# Patient Record
Sex: Female | Born: 1985
Health system: Southern US, Community
[De-identification: ages and names within clinical notes are randomized; demographics above are authoritative.]

## PROBLEM LIST (undated history)

## (undated) DIAGNOSIS — K219 Gastro-esophageal reflux disease without esophagitis: Secondary | ICD-10-CM

## (undated) DIAGNOSIS — O24419 Gestational diabetes mellitus in pregnancy, unspecified control: Secondary | ICD-10-CM

## (undated) HISTORY — DX: Gestational diabetes mellitus in pregnancy, unspecified control: O24.419

---
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
## 2007-04-12 IMAGING — CT CT HEAD WITHOUT CONTRAST
2 series · 16 of 30 positions shown, 20 images · non-contrast
Comparison: none

REASON FOR EXAM: Headaches
COMMENTS:

[Series 2: without · axial · non-contrast · 0.39mm/px · z∈[+454,+574]mm · 13 of 29 slices shown, 17 images]
[im 3/29  brain]
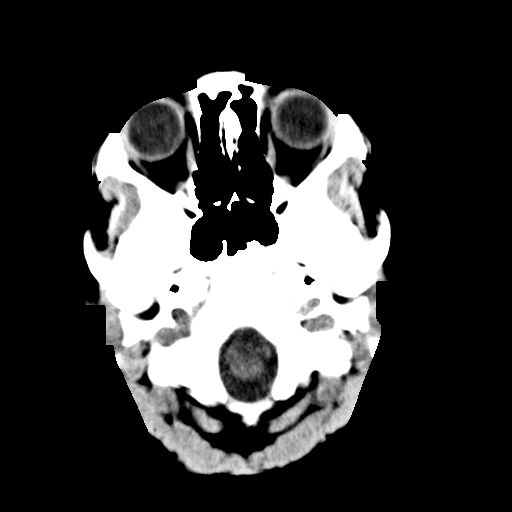
[im 3/29  bone]
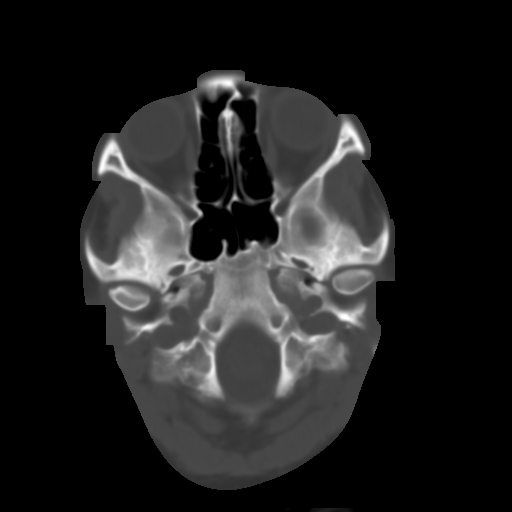
[im 5/29  brain]
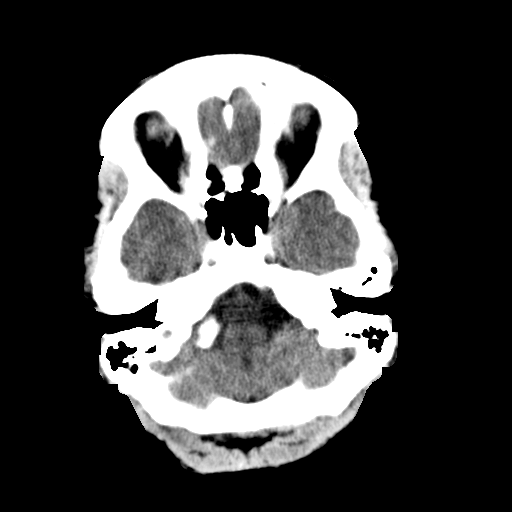
[im 7/29  brain]
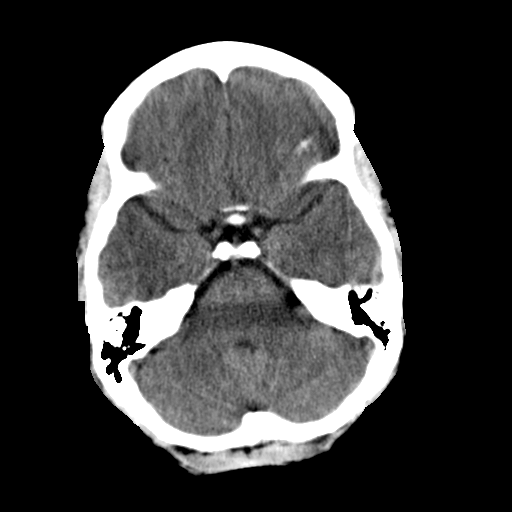
[im 9/29  brain]
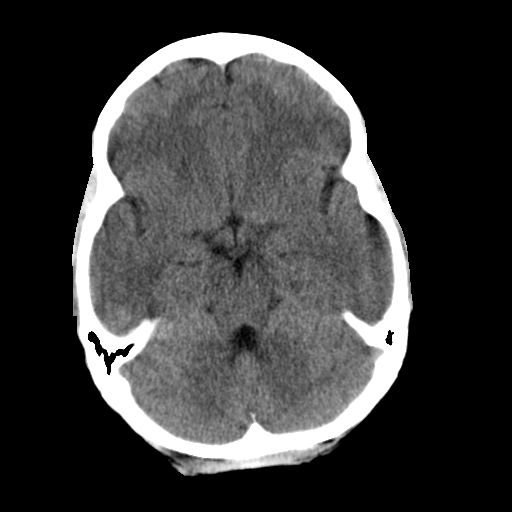
[im 11/29  brain]
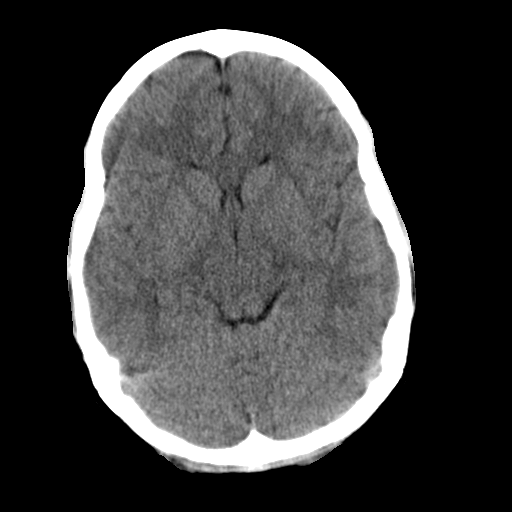
[im 11/29  bone]
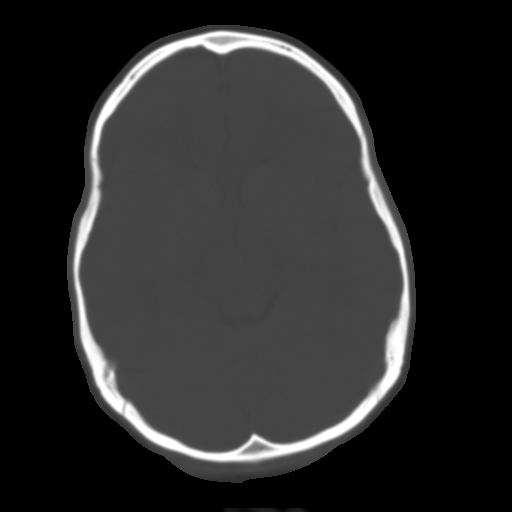
[im 13/29  brain]
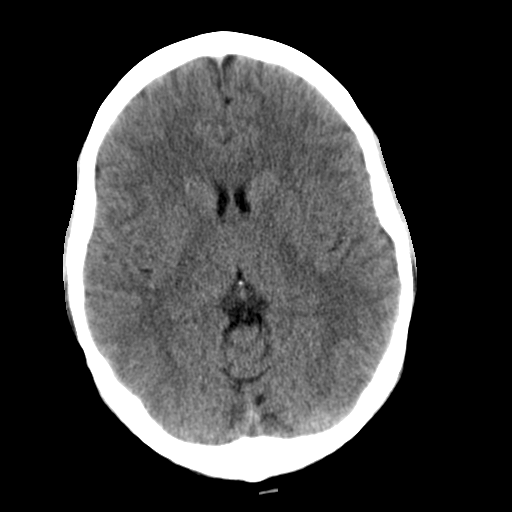
[im 15/29  brain]
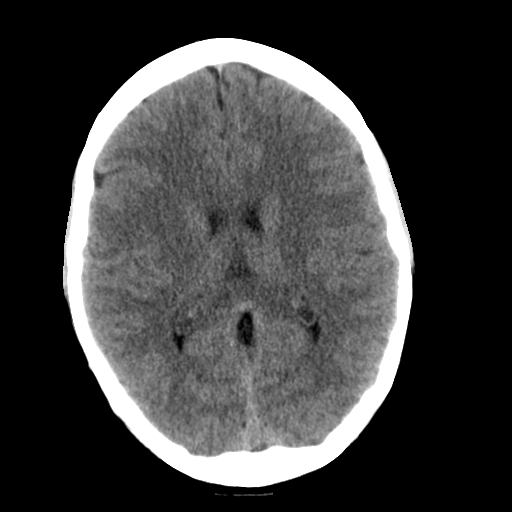
[im 17/29  brain]
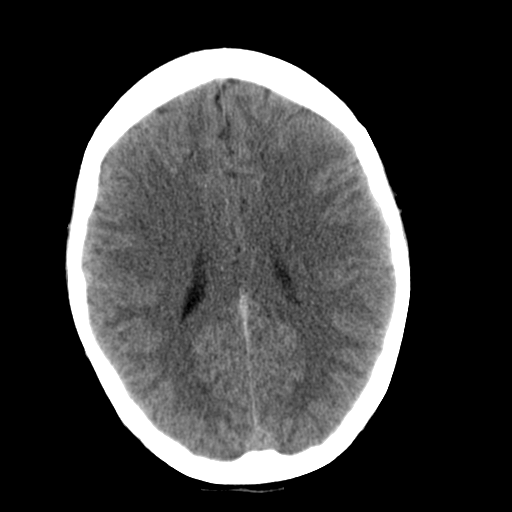
[im 19/29  brain]
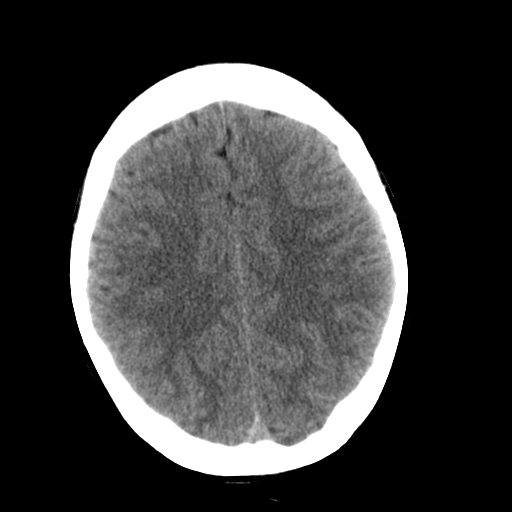
[im 19/29  bone]
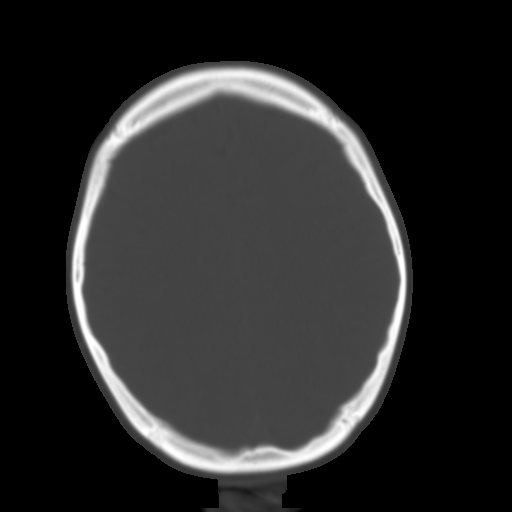
[im 21/29  brain]
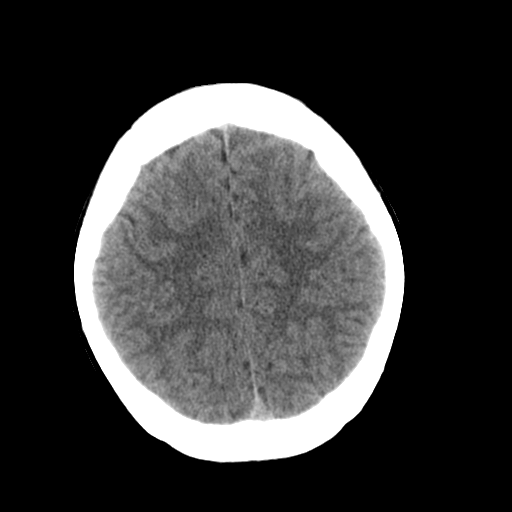
[im 23/29  brain]
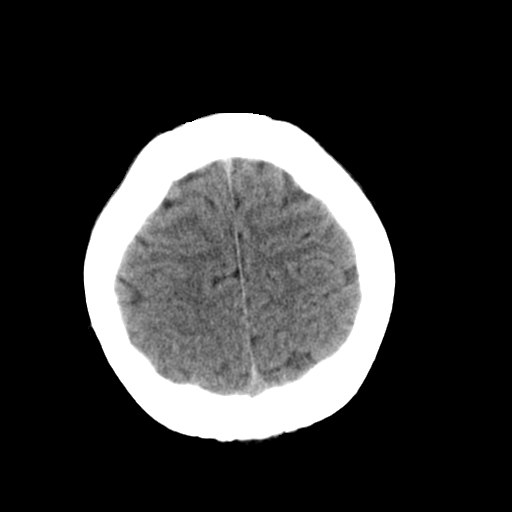
[im 25/29  brain]
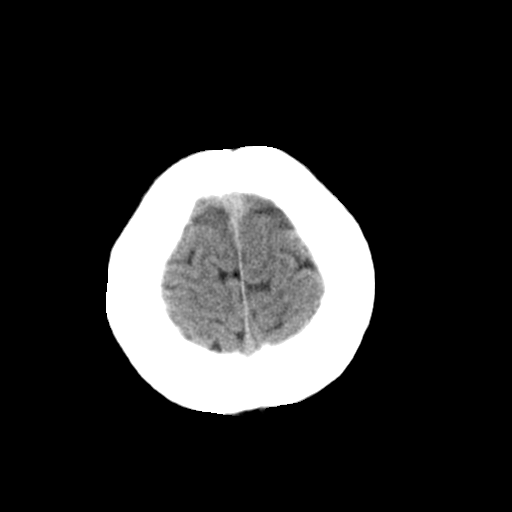
[im 27/29  brain]
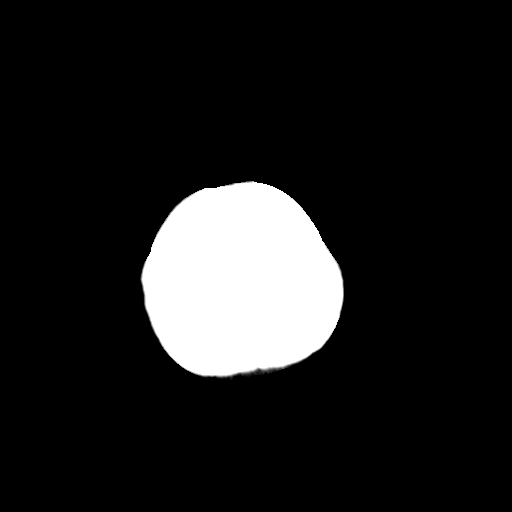
[im 27/29  bone]
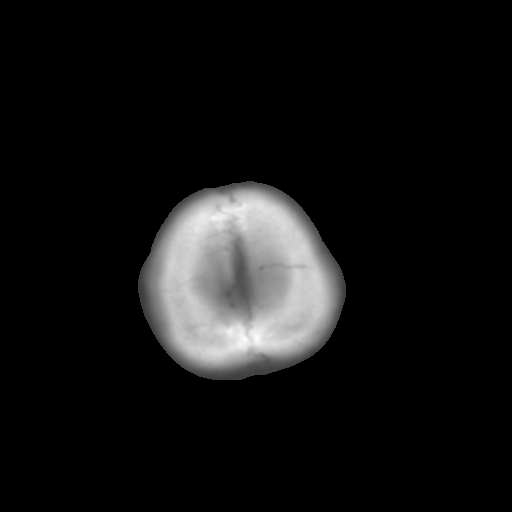

[Series 3: bone · axial · 0.39mm/px · z∈[+454,+494]mm · 3 of 29 slices shown]
[im 3/29  bone]
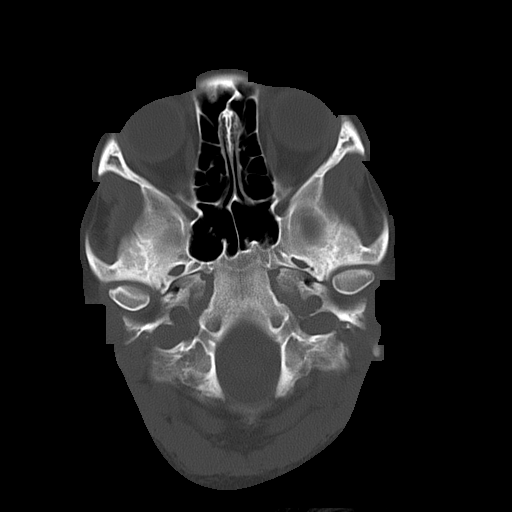
[im 7/29  bone]
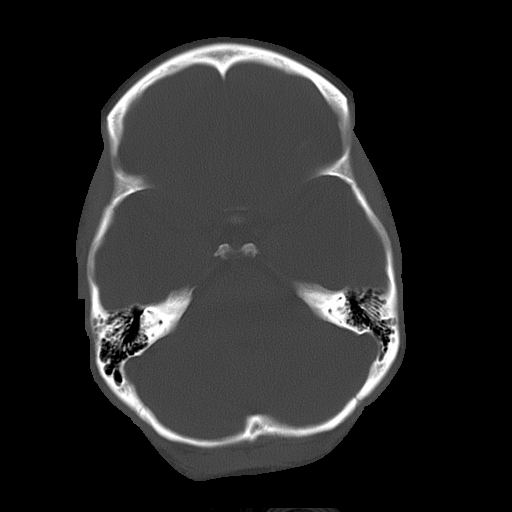
[im 11/29  bone]
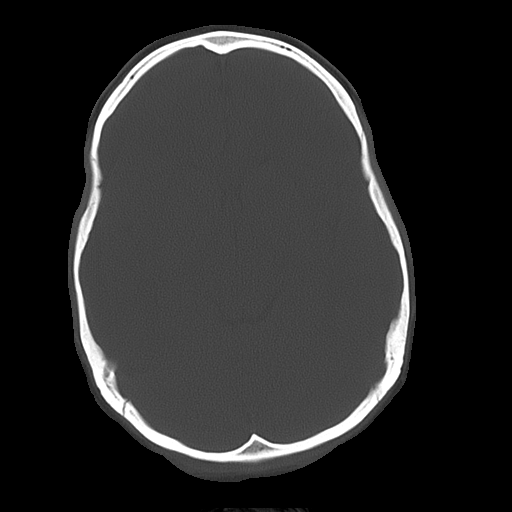

[16 of 30 positions shown; findings below may reference images not displayed]

PROCEDURE:     CT  - CT HEAD WITHOUT CONTRAST  - June 30, 2006  [DATE]

RESULT:     An enhanced CT scan was performed for bilateral temporal
headaches.  No subarachnoid hemorrhage, no intracerebral bleeds.  No mass
effect.  No shift of the midline.  The ventricles appear within normal
limits.  No extraaxial fluid collections are identified.

On the bone window settings, the sinuses and mastoids appear clear.  No
obvious abnormalities are detected.
IMPRESSION: No significant abnormalities seen on the unenhanced Head CT.

## 2009-06-14 ENCOUNTER — Emergency Department: Payer: Self-pay | Admitting: Emergency Medicine

## 2009-06-16 ENCOUNTER — Emergency Department: Payer: Self-pay | Admitting: Emergency Medicine

## 2009-06-16 ENCOUNTER — Other Ambulatory Visit: Payer: Self-pay | Admitting: Emergency Medicine

## 2010-11-01 ENCOUNTER — Emergency Department: Payer: Self-pay | Admitting: Emergency Medicine

## 2010-11-02 ENCOUNTER — Emergency Department: Payer: Self-pay | Admitting: Emergency Medicine

## 2011-12-04 ENCOUNTER — Observation Stay: Payer: Self-pay | Admitting: Obstetrics and Gynecology

## 2011-12-04 LAB — URINALYSIS, COMPLETE
Bacteria: NONE SEEN
Bilirubin,UR: NEGATIVE
Blood: NEGATIVE
Glucose,UR: NEGATIVE mg/dL (ref 0–75)
Leukocyte Esterase: NEGATIVE
Nitrite: NEGATIVE
Ph: 5 (ref 4.5–8.0)
Protein: NEGATIVE
RBC,UR: 1 /HPF (ref 0–5)
WBC UR: <1 /HPF (ref 0–5)

## 2012-03-14 DIAGNOSIS — O24419 Gestational diabetes mellitus in pregnancy, unspecified control: Secondary | ICD-10-CM

## 2014-12-05 NOTE — H&P (Signed)
L&D Evaluation:  History:   HPI [age] yo G1 at 7486w0d gestational age per patient report (no prenatal records available as [facility]), pregnancy has been uncomplicated.  States that she has felt some bilateral suprapubic supra-inguinal discomfort that is associated with movement.  She has noted burning during urination and a burning vaginal sensation.  She notes positive fetal movement, no leakage of fluid, no vaginal bleeding.  She is unsure of whether she is having contractions.    Patient's Medical History No Chronic Illness    Patient's Surgical History none    Medications Pre Natal Vitamins    Allergies NKDA    Social History none    Family History Non-Contributory   ROS:   ROS All systems were reviewed.  HEENT, CNS, GI, GU, Respiratory, CV, Renal and Musculoskeletal systems were found to be normal., x10 unless noted in HPI.   Exam:   Urine Protein negative dipstick    General no apparent distress    Mental Status clear    Chest clear    Heart normal sinus rhythm    Abdomen gravid, non-tender    Estimated Fetal Weight Average for gestational age    Back no CVAT    Edema no edema    Pelvic no external lesions, cervix closed and thick    Mebranes Intact    FHT normal rate with no decels    FHT Description 140/mod variability/no accels/no decels    Ucx absent    Skin no lesions    Other U/A: negative Wet mount: neg clue cells, neg fungal elements, neg trich   Impression:   Impression round ligament pain, no evidence of UTI or labor   Plan:   Plan UA, discharge    Comments precautions given and patient reassured.    Follow Up Appointment already scheduled   Electronic Signatures: Conard NovakJackson, Isobel Eisenhuth D (MD)  (Signed 09-May-13 14:39)  Authored: L&D Evaluation   Last Updated: 09-May-13 14:39 by Conard NovakJackson, Ezzie Senat D (MD)

## 2018-09-30 ENCOUNTER — Other Ambulatory Visit: Payer: Self-pay

## 2018-10-01 LAB — CMP12+LP+TP+TSH+6AC+CBC/D/PLT
A/G RATIO: 1.4 (ref 1.2–2.2)
ALBUMIN: 4.3 g/dL (ref 3.9–5.0)
ALT: 14 IU/L (ref 0–32)
AST: 18 IU/L (ref 0–40)
Alkaline Phosphatase: 42 IU/L (ref 39–117)
BILIRUBIN TOTAL: 0.3 mg/dL (ref 0.0–1.2)
BUN/Creatinine Ratio: 13 (ref 9–23)
BUN: 8 mg/dL (ref 6–20)
Basophils Absolute: 0 10*3/uL (ref 0.0–0.2)
Basos: 0 %
CALCIUM: 9.1 mg/dL (ref 8.7–10.2)
CHOL/HDL RATIO: 3 ratio (ref 0.0–4.4)
CHOLESTEROL TOTAL: 157 mg/dL (ref 100–199)
CREATININE: 0.62 mg/dL (ref 0.57–1.00)
Chloride: 103 mmol/L (ref 96–106)
EOS (ABSOLUTE): 0 10*3/uL (ref 0.0–0.4)
Eos: 0 %
Estimated CHD Risk: <0.5 times avg. (ref 0.0–1.0)
Free Thyroxine Index: 2.1 (ref 1.2–4.9)
GFR calc Af Amer: 140 mL/min/{1.73_m2} (ref >59)
GFR calc non Af Amer: 121 mL/min/{1.73_m2} (ref >59)
GGT: 15 IU/L (ref 0–60)
GLOBULIN, TOTAL: 3.1 g/dL (ref 1.5–4.5)
Glucose: 127 mg/dL — ABNORMAL HIGH (ref 65–99)
HDL: 52 mg/dL (ref >39)
Hematocrit: 33.8 % — ABNORMAL LOW (ref 34.0–46.6)
Hemoglobin: 11.7 g/dL (ref 11.1–15.9)
Immature Grans (Abs): 0 10*3/uL (ref 0.0–0.1)
Immature Granulocytes: 0 %
Iron: 159 ug/dL (ref 27–159)
LDH: 124 IU/L (ref 119–226)
LDL CALC: 91 mg/dL (ref 0–99)
Lymphocytes Absolute: 1.6 10*3/uL (ref 0.7–3.1)
Lymphs: 30 %
MCH: 30.9 pg (ref 26.6–33.0)
MCHC: 34.6 g/dL (ref 31.5–35.7)
MCV: 89 fL (ref 79–97)
MONOCYTES: 8 %
Monocytes Absolute: 0.4 10*3/uL (ref 0.1–0.9)
NEUTROS ABS: 3.4 10*3/uL (ref 1.4–7.0)
Neutrophils: 62 %
POTASSIUM: 3.7 mmol/L (ref 3.5–5.2)
Phosphorus: 3.1 mg/dL (ref 3.0–4.3)
Platelets: 277 10*3/uL (ref 150–450)
RBC: 3.79 x10E6/uL (ref 3.77–5.28)
RDW: 12.1 % (ref 11.7–15.4)
SODIUM: 135 mmol/L (ref 134–144)
T3 Uptake Ratio: 22 % — ABNORMAL LOW (ref 24–39)
T4, Total: 9.6 ug/dL (ref 4.5–12.0)
TSH: 0.816 u[IU]/mL (ref 0.450–4.500)
Total Protein: 7.4 g/dL (ref 6.0–8.5)
Triglycerides: 68 mg/dL (ref 0–149)
Uric Acid: 2.8 mg/dL (ref 2.5–7.1)
VLDL CHOLESTEROL CAL: 14 mg/dL (ref 5–40)
WBC: 5.5 10*3/uL (ref 3.4–10.8)

## 2018-10-01 LAB — HCG, SERUM, QUALITATIVE: hCG,Beta Subunit,Qual,Serum: POSITIVE m[IU]/mL — AB (ref <6)

## 2018-10-01 LAB — T4, FREE: FREE T4: 1.27 ng/dL (ref 0.82–1.77)

## 2018-10-11 ENCOUNTER — Ambulatory Visit (INDEPENDENT_AMBULATORY_CARE_PROVIDER_SITE_OTHER): Payer: BLUE CROSS/BLUE SHIELD | Admitting: Advanced Practice Midwife

## 2018-10-11 ENCOUNTER — Encounter: Payer: Self-pay | Admitting: Advanced Practice Midwife

## 2018-10-11 ENCOUNTER — Other Ambulatory Visit (HOSPITAL_COMMUNITY)
Admission: RE | Admit: 2018-10-11 | Discharge: 2018-10-11 | Disposition: A | Discharge status: Home / Self Care | Payer: BLUE CROSS/BLUE SHIELD | Source: Ambulatory Visit | Attending: Advanced Practice Midwife | Admitting: Advanced Practice Midwife

## 2018-10-11 ENCOUNTER — Other Ambulatory Visit: Payer: Self-pay

## 2018-10-11 VITALS — BP 124/68 | Wt 116.0 lb

## 2018-10-11 DIAGNOSIS — Z113 Encounter for screening for infections with a predominantly sexual mode of transmission: Secondary | ICD-10-CM | POA: Insufficient documentation

## 2018-10-11 DIAGNOSIS — Z124 Encounter for screening for malignant neoplasm of cervix: Secondary | ICD-10-CM | POA: Diagnosis not present

## 2018-10-11 DIAGNOSIS — Z3480 Encounter for supervision of other normal pregnancy, unspecified trimester: Secondary | ICD-10-CM | POA: Insufficient documentation

## 2018-10-11 DIAGNOSIS — O09291 Supervision of pregnancy with other poor reproductive or obstetric history, first trimester: Secondary | ICD-10-CM

## 2018-10-11 DIAGNOSIS — O34219 Maternal care for unspecified type scar from previous cesarean delivery: Secondary | ICD-10-CM

## 2018-10-11 DIAGNOSIS — Z3A01 Less than 8 weeks gestation of pregnancy: Secondary | ICD-10-CM

## 2018-10-11 DIAGNOSIS — O0990 Supervision of high risk pregnancy, unspecified, unspecified trimester: Secondary | ICD-10-CM | POA: Insufficient documentation

## 2018-10-11 NOTE — Progress Notes (Signed)
NOB Anxiety/SOB

## 2018-10-11 NOTE — Patient Instructions (Signed)
Exercise During Pregnancy For people of all ages, exercise is an important part of being healthy. Exercise improves heart and lung function and helps to maintain strength, flexibility, and a healthy body weight. Exercise also boosts energy levels and elevates mood. For most women, maintaining an exercise routine throughout pregnancy is recommended. It is only on rare occasions and with certain medical conditions or pregnancy complications that women may be asked to limit or avoid exercise during pregnancy. What are some other benefits to exercising during pregnancy? Along with maintaining strength and flexibility, exercising throughout pregnancy can help to:  Keep strength in muscles that are very important during labor and childbirth.  Decrease low back pain during pregnancy.  Decrease the risk of developing gestational diabetes mellitus (GDM).  Improve blood sugar (glucose) control for women who have GDM.  Decrease the risk of developing preeclampsia. This is a serious condition that causes high blood pressure along with other symptoms, such as swelling and headaches.  Decrease the risk of cesarean delivery.  Speed up the recovery after giving birth. How often should I exercise? Unless your health care provider gives you different instructions, you should try to exercise on most days or all days of the week. In general, try to exercise with moderate intensity for about 150 minutes per week. This can be spread out across several days, such as exercising for 30 minutes per day on 5 days of each week. You can tell that you are exercising at a moderate intensity if you have a higher heart rate and faster breathing, but you are still able to hold a conversation. What types of moderate-intensity exercise are recommended during pregnancy? There are many types of exercise that are safe for you to do during pregnancy. Unless your health care provider gives you different instructions, do a variety of  exercises that safely increase your heart and breathing (cardiopulmonary) rates and help you to build and maintain muscle strength (strength training). You should always be able to talk in full sentences while exercising during pregnancy. Some examples of exercising that is safe to do during pregnancy include:  Brisk walking or hiking.  Swimming.  Water aerobics.  Riding a stationary bike.  Strength training.  Modified yoga or Pilates. Tell your instructor that you are pregnant. Avoid overstretching and avoid lying on your back for long periods of time.  Running or jogging. Only choose this type of exercise if: ? You ran or jogged regularly before your pregnancy. ? You can run or jog and still talk in complete sentences. What types of exercise should I not do during pregnancy? Depending on your level of fitness and whether you exercised regularly before your pregnancy, you may be advised to limit vigorous-intensity exercise during your pregnancy. You can tell that you are exercising at a vigorous intensity if you are breathing much harder and faster and cannot hold a conversation while exercising. Some examples of exercising that you should avoid during pregnancy include:  Contact sports.  Activities that place you at risk for falling on or being hit in the belly, such as downhill skiing, water skiing, surfing, rock climbing, cycling, gymnastics, and horseback riding.  Scuba diving.  Sky diving.  Yoga or Pilates in a room that is heated to extreme temperatures ("hot yoga" or "hot Pilates").  Jogging or running, unless you ran or jogged regularly before your pregnancy. While jogging or running, you should always be able to talk in full sentences. Do not run or jog so vigorously that you   are unable to have a conversation.  If you are not used to exercising at elevation (more than 6,000 feet above sea level), do not do so during your pregnancy. When should I avoid exercising during  pregnancy? Certain medical conditions can make it unsafe to exercise during pregnancy, or they may increase your risk of miscarriage or early labor and birth. Some of these conditions include:  Some types of heart disease.  Some types of lung disease.  Placenta previa. This is when the placenta partially or completely covers the opening of the uterus (cervix).  Frequent bleeding from the vagina during your pregnancy.  Incompetent cervix. This is when your cervix does not remain as tightly closed during pregnancy as it should.  Premature labor.  Ruptured membranes. This is when the protective sac (amniotic sac) opens up and amniotic fluid leaks from your vagina.  Severely low blood count (anemia).  Preeclampsia or pregnancy-caused high blood pressure.  Carrying more than one baby (multiple gestation) and having an additional risk of early labor.  Poorly controlled diabetes.  Being severely underweight or severely overweight.  Intrauterine growth restriction. This is when your baby's growth and development during pregnancy are slower than expected.  Other medical conditions. Ask your health care provider if any apply to you. What else should I know about exercising during pregnancy? You should take these precautions while exercising during pregnancy:  Avoid overheating. ? Wear loose-fitting, breathable clothes. ? Do not exercise in very high temperatures.  Avoid dehydration. Drink enough water before, during, and after exercise to keep your urine clear or pale yellow.  Avoid overstretching. Because of hormone changes during pregnancy, it is easy to overstretch muscles, tendons, and ligaments during pregnancy.  Start slowly and ask your health care provider to recommend types of exercise that are safe for you, if exercising regularly is new for you. Pregnancy is not a time for exercising to lose weight. When should I seek medical care? You should stop exercising and call your  health care provider if you have any unusual symptoms, such as:  Mild uterine contractions or abdominal cramping.  Dizziness that does not improve with rest. When should I seek immediate medical care? You should stop exercising and call your local emergency services (911 in the [place] if you have any unusual symptoms, such as:  Sudden, severe pain in your low back or your belly.  Uterine contractions or abdominal cramping that do not improve with rest.  Chest pain.  Bleeding or fluid leaking from your vagina.  Shortness of breath. This information is not intended to replace advice given to you by your health care provider. Make sure you discuss any questions you have with your health care provider. Document Released: 07/14/2005 Document Revised: 12/12/2015 Document Reviewed: 09/21/2014 Elsevier Interactive Patient Education  2019 [employer] Eating Plan for Pregnant Women While you are pregnant, your body requires additional nutrition to help support your growing baby. You also have a higher need for some vitamins and minerals, such as folic acid, calcium, iron, and vitamin D. Eating a healthy, well-balanced diet is very important for your health and your baby's health. Your need for extra calories varies for the three 3-month segments of your pregnancy (trimesters). For most women, it is recommended to consume:  150 extra calories a day during the first trimester.  300 extra calories a day during the second trimester.  300 extra calories a day during the third trimester. What are tips for following this plan?   Do   not try to lose weight or go on a diet during pregnancy.  Limit your overall intake of foods that have "empty calories." These are foods that have little nutritional value, such as sweets, desserts, candies, and sugar-sweetened beverages.  Eat a variety of foods (especially fruits and vegetables) to get a full range of vitamins and minerals.  Take a prenatal vitamin  to help meet your additional vitamin and mineral needs during pregnancy, specifically for folic acid, iron, calcium, and vitamin D.  Remember to stay active. Ask your health care provider what types of exercise and activities are safe for you.  Practice good food safety and cleanliness. Wash your hands before you eat and after you prepare raw meat. Wash all fruits and vegetables well before peeling or eating. Taking these actions can help to prevent food-borne illnesses that can be very dangerous to your baby, such as listeriosis. Ask your health care provider for more information about listeriosis. What does 150 extra calories look like? Healthy options that provide 150 extra calories each day could be any of the following:  6-8 oz (170-230 g) of plain low-fat yogurt with  cup of berries.  1 apple with 2 teaspoons (11 g) of peanut butter.  Cut-up vegetables with  cup (60 g) of hummus.  8 oz (230 mL) or 1 cup of low-fat chocolate milk.  1 stick of string cheese with 1 medium orange.  1 peanut butter and jelly sandwich that is made with one slice of whole-wheat bread and 1 tsp (5 g) of peanut butter. For 300 extra calories, you could eat two of those healthy options each day. What is a healthy amount of weight to gain? The right amount of weight gain for you is based on your BMI before you became pregnant. If your BMI:  Was less than 18 (underweight), you should gain 28-40 lb (13-18 kg).  Was 18-24.9 (normal), you should gain 25-35 lb (11-16 kg).  Was 25-29.9 (overweight), you should gain 15-25 lb (7-11 kg).  Was 30 or greater (obese), you should gain 11-20 lb (5-9 kg). What if I am having twins or multiples? Generally, if you are carrying twins or multiples:  You may need to eat 300-600 extra calories a day.  The recommended range for total weight gain is 25-54 lb (11-25 kg), depending on your BMI before pregnancy.  Talk with your health care provider to find out about  nutritional needs, weight gain, and exercise that is right for you. What foods can I eat?  Grains All grains. Choose whole grains, such as whole-wheat bread, oatmeal, or brown rice. Vegetables All vegetables. Eat a variety of colors and types of vegetables. Remember to wash your vegetables well before peeling or eating. Fruits All fruits. Eat a variety of colors and types of fruit. Remember to wash your fruits well before peeling or eating. Meats and other protein foods Lean meats, including chicken, turkey, fish, and lean cuts of beef, veal, or pork. If you eat fish or seafood, choose options that are higher in omega-3 fatty acids and lower in mercury, such as salmon, herring, mussels, trout, sardines, pollock, shrimp, crab, and lobster. Tofu. Tempeh. Beans. Eggs. Peanut butter and other nut butters. Make sure that all meats, poultry, and eggs are cooked to food-safe temperatures or "well-done." Two or more servings of fish are recommended each week in order to get the most benefits from omega-3 fatty acids that are found in seafood. Choose fish that are lower in mercury. You can   find more information online:  www.fda.gov Dairy Pasteurized milk and milk alternatives (such as almond milk). Pasteurized yogurt and pasteurized cheese. Cottage cheese. Sour cream. Beverages Water. Juices that contain 100% fruit juice or vegetable juice. Caffeine-free teas and decaffeinated coffee. Drinks that contain caffeine are okay to drink, but it is better to avoid caffeine. Keep your total caffeine intake to less than 200 mg each day (which is 12 oz or 355 mL of coffee, tea, or soda) or the limit as told by your health care provider. Fats and oils Fats and oils are okay to include in moderation. Sweets and desserts Sweets and desserts are okay to include in moderation. Seasoning and other foods All pasteurized condiments. The items listed above may not be a complete list of recommended foods and beverages.  Contact your dietitian for more options. What foods are not recommended? Vegetables Raw (unpasteurized) vegetable juices. Fruits Unpasteurized fruit juices. Meats and other protein foods Lunch meats, bologna, hot dogs, or other deli meats. (If you must eat those meats, reheat them until they are steaming hot.) Refrigerated pat, meat spreads from a meat counter, smoked seafood that is found in the refrigerated section of a store. Raw or undercooked meats, poultry, and eggs. Raw fish, such as sushi or sashimi. Fish that have high mercury content, such as tilefish, shark, swordfish, and king mackerel. To learn more about mercury in fish, talk with your health care provider or look for online resources, such as:  www.fda.gov Dairy Raw (unpasteurized) milk and any foods that have raw milk in them. Soft cheeses, such as feta, queso blanco, queso fresco, Brie, Camembert cheeses, blue-veined cheeses, and Panela cheese (unless it is made with pasteurized milk, which must be stated on the label). Beverages Alcohol. Sugar-sweetened beverages, such as sodas, teas, or energy drinks. Seasoning and other foods Homemade fermented foods and drinks, such as pickles, sauerkraut, or kombucha drinks. (Store-bought pasteurized versions of these are okay.) Salads that are made in a store or deli, such as ham salad, chicken salad, egg salad, tuna salad, and seafood salad. The items listed above may not be a complete list of foods and beverages to avoid. Contact your dietitian for more information. Where to find more information To calculate the number of calories you need based on your height, weight, and activity level, you can use an online calculator such as:  www.choosemyplate.gov/MyPlatePlan To calculate how much weight you should gain during pregnancy, you can use an online pregnancy weight gain calculator such as:  www.choosemyplate.gov/pregnancy-weight-gain-calculator Summary  While you are pregnant,  your body requires additional nutrition to help support your growing baby.  Eat a variety of foods, especially fruits and vegetables to get a full range of vitamins and minerals.  Practice good food safety and cleanliness. Wash your hands before you eat and after you prepare raw meat. Wash all fruits and vegetables well before peeling or eating. Taking these actions can help to prevent food-borne illnesses, such as listeriosis, that can be very dangerous to your baby.  Do not eat raw meat or fish. Do not eat fish that have high mercury content, such as tilefish, shark, swordfish, and king mackerel. Do not eat unpasteurized (raw) dairy.  Take a prenatal vitamin to help meet your additional vitamin and mineral needs during pregnancy, specifically for folic acid, iron, calcium, and vitamin D. This information is not intended to replace advice given to you by your health care provider. Make sure you discuss any questions you have with your health care   provider. Document Released: 04/28/2014 Document Revised: 04/10/2017 Document Reviewed: 04/10/2017 Elsevier Interactive Patient Education  2019 [employer] Prenatal Care Prenatal care is health care during pregnancy. It helps you and your unborn baby (fetus) stay as healthy as possible. Prenatal care may be provided by a midwife, a family practice health care provider, or a childbirth and pregnancy specialist (obstetrician). How does this affect me? During pregnancy, you will be closely monitored for any new conditions that might develop. To lower your risk of pregnancy complications, you and your health care provider will talk about any underlying conditions you have. How does this affect my baby? Early and consistent prenatal care increases the chance that your baby will be healthy during pregnancy. Prenatal care lowers the risk that your baby will be:  Born early (prematurely).  Smaller than expected at birth (small for gestational age). What  can I expect at the first prenatal care visit? Your first prenatal care visit will likely be the longest. You should schedule your first prenatal care visit as soon as you know that you are pregnant. Your first visit is a good time to talk about any questions or concerns you have about pregnancy. At your visit, you and your health care provider will talk about:  Your medical history, including: ? Any past pregnancies. ? Your family's medical history. ? The baby's father's medical history. ? Any long-term (chronic) health conditions you have and how you manage them. ? Any surgeries or procedures you have had. ? Any current over-the-counter or prescription medicines, herbs, or supplements you are taking.  Other factors that could pose a risk to your baby, including:  Your home setting and your stress levels, including: ? Exposure to abuse or violence. ? Household financial strain. ? Mental health conditions you have.  Your daily health habits, including diet and exercise. Your health care provider will also:  Measure your weight, height, and blood pressure.  Do a physical exam, including a pelvic and breast exam.  Perform blood tests and urine tests to check for: ? Urinary tract infection. ? Sexually transmitted infections (STIs). ? Low iron levels in your blood (anemia). ? Blood type and certain proteins on red blood cells (Rh antibodies). ? Infections and immunity to viruses, such as hepatitis B and rubella. ? HIV (human immunodeficiency virus).  Do an ultrasound to confirm your baby's growth and development and to help predict your estimated due date (EDD). This ultrasound is done with a probe that is inserted into the vagina (transvaginal ultrasound).  Discuss your options for genetic screening.  Give you information about how to keep yourself and your baby healthy, including: ? Nutrition and taking vitamins. ? Physical activity. ? How to manage pregnancy symptoms such as  nausea and vomiting (morning sickness). ? Infections and substances that may be harmful to your baby and how to avoid them. ? Food safety. ? Dental care. ? Working. ? Travel. ? Warning signs to watch for and when to call your health care provider. How often will I have prenatal care visits? After your first prenatal care visit, you will have regular visits throughout your pregnancy. The visit schedule is often as follows:  Up to week 28 of pregnancy: once every 4 weeks.  28-36 weeks: once every 2 weeks.  After 36 weeks: every week until delivery. Some women may have visits more or less often depending on any underlying health conditions and the health of the baby. Keep all follow-up and prenatal care visits as told by   your health care provider. This is important. What happens during routine prenatal care visits? Your health care provider will:  Measure your weight and blood pressure.  Check for fetal heart sounds.  Measure the height of your uterus in your abdomen (fundal height). This may be measured starting around week 20 of pregnancy.  Check the position of your baby inside your uterus.  Ask questions about your diet, sleeping patterns, and whether you can feel the baby move.  Review warning signs to watch for and signs of labor.  Ask about any pregnancy symptoms you are having and how you are dealing with them. Symptoms may include: ? Headaches. ? Nausea and vomiting. ? Vaginal discharge. ? Swelling. ? Fatigue. ? Constipation. ? Any discomfort, including back or pelvic pain. Make a list of questions to ask your health care provider at your routine visits. What tests might I have during prenatal care visits? You may have blood, urine, and imaging tests throughout your pregnancy, such as:  Urine tests to check for glucose, protein, or signs of infection.  Glucose tests to check for a form of diabetes that can develop during pregnancy (gestational diabetes mellitus).  This is usually done around week 24 of pregnancy.  An ultrasound to check your baby's growth and development and to check for birth defects. This is usually done around week 20 of pregnancy.  A test to check for group B strep (GBS) infection. This is usually done around week 36 of pregnancy.  Genetic testing. This may include blood or imaging tests, such as an ultrasound. Some genetic tests are done during the first trimester and some are done during the second trimester. What else can I expect during prenatal care visits? Your health care provider may recommend getting certain vaccines during pregnancy. These may include:  A yearly flu shot (annual influenza vaccine). This is especially important if you will be pregnant during flu season.  Tdap (tetanus, diphtheria, pertussis) vaccine. Getting this vaccine during pregnancy can protect your baby from whooping cough (pertussis) after birth. This vaccine may be recommended between weeks 27 and 36 of pregnancy. Later in your pregnancy, your health care provider may give you information about:  Childbirth and breastfeeding classes.  Choosing a health care provider for your baby.  Umbilical cord banking.  Breastfeeding.  Birth control after your baby is born.  The hospital labor and delivery unit and how to tour it.  Registering at the hospital before you go into labor. Where to find more information  Office on Women's Health: womenshealth.gov  American Pregnancy Association: americanpregnancy.org  March of Dimes: marchofdimes.org Summary  Prenatal care helps you and your baby stay as healthy as possible during pregnancy.  Your first prenatal care visit will most likely be the longest.  You will have visits and tests throughout your pregnancy to monitor your health and your baby's health.  Bring a list of questions to your visits to ask your health care provider.  Make sure to keep all follow-up and prenatal care visits with  your health care provider. This information is not intended to replace advice given to you by your health care provider. Make sure you discuss any questions you have with your health care provider. Document Released: 07/17/2003 Document Revised: 07/13/2017 Document Reviewed: 07/13/2017 Elsevier Interactive Patient Education  2019 [employer]  

## 2018-10-11 NOTE — Progress Notes (Addendum)
New Obstetric Patient H&P    Chief Complaint: "Desires prenatal care"   History of Present Illness: Patient is a 33 y.o. [place] or [place] female, presents with amenorrhea and positive home pregnancy test. Patient's last menstrual period was 08/19/2018 (approximate). and based on her  LMP, her EDD is Estimated Date of Delivery: 05/26/19 and her EGA is [redacted]w[redacted]d Cycles are 4. days, regular, and occur approximately every : 28 days. She was on birth control pills at the time of conception and admits occasionally missing a pill. The pregnancy was unplanned. Her last pap smear was 3 or 4 years ago and was no abnormalities.    She had a urine pregnancy test which was positive 2 week(s)  ago. Her last menstrual period was normal and lasted for  4 day(s). Since her LMP she claims she has experienced breast tenderness, fatigue, nausea, vomiting. She denies vaginal bleeding. Her past medical history is noncontributory. Her prior pregnancies are notable for 2011 early SAB, 2013 C/S arrest of descent, pregnancy complicated by GDM.  Since her LMP, she admits to the use of tobacco products  no She claims she has gained   no pounds since the start of her pregnancy.  There are cats in the home in the home  no  She admits close contact with children on a regular basis  yes  She has had chicken pox in the past yes She has had Tuberculosis exposures, symptoms, or previously tested positive for TB   no Current or past history of domestic violence. no  Genetic Screening/Teratology Counseling: (Includes patient, baby's father, or anyone in either family with:)   [facility] 2. Thalassemia ([place] or [place] background): MCV<80  no 3. Neural tube defect (meningomyelocele, spina bifida, anencephaly)  no 4. Congenital heart defect  no  5. Down syndrome  no 6. Tay-Sachs (Jewish, [place]  no 7. Canavan's Disease  no 8. Sickle cell disease or trait  (African)  no  9. Hemophilia or other blood disorders  no  10. Muscular dystrophy  no  11. Cystic fibrosis  no  12. Huntington's Chorea  no  13. Mental retardation/autism  no 14. Other inherited genetic or chromosomal disorder  no 15. Maternal metabolic disorder (DM, PKU, etc)  no 16. Patient or FOB with a child with a birth defect not listed above no  16a. Patient or FOB with a birth defect themselves no 17. Recurrent pregnancy loss, or stillbirth  no  18. Any medications since LMP other than prenatal vitamins (include vitamins, supplements, OTC meds, drugs, alcohol)  no 19. Any other genetic/environmental exposure to discuss  no  Infection History:   1. Lives with someone with TB or TB exposed  no  2. Patient or partner has history of genital herpes  no 3. Rash or viral illness since LMP  no 4. History of STI (GC, CT, HPV, syphilis, HIV)  no 5. History of recent travel :  no  Other pertinent information:  Has had anxiety that started recently related to job and pregnancy. Part of her anxiety comes from having had a prior miscarriage. She has had some shortness of breath associated with the anxiety, but denies that currently. She denies the need for medication. Reviewed some non-pharmacological coping techniques. Patient instructed to let [place] of any worsening of symptoms. She also plans to request light duty at work.  This is her husband's first baby. He is also a police  Garment/textile technologist.    Review of Systems:10 point review of systems negative unless otherwise noted in HPI  Past Medical History:  Past Medical History:  Diagnosis Date  . GDM (gestational diabetes mellitus)     Past Surgical History:  Past Surgical History:  Procedure Laterality Date  . CESAREAN SECTION  2013   Gestation Diabetes/INduction    Gynecologic History: Patient's last menstrual period was 08/19/2018 (approximate).  Obstetric History: G3P1011  Family History:  Family History  Problem Relation Age  of Onset  . Multiple myeloma Mother     Social History:  Social History   Socioeconomic History  . Marital status: Single    Spouse name: Not on file  . Number of children: Not on file  . Years of education: Not on file  . Highest education level: Not on file  Occupational History  . Not on file  Social Needs  . Financial resource strain: Not on file  . Food insecurity:    Worry: Not on file    Inability: Not on file  . Transportation needs:    Medical: Not on file    Non-medical: Not on file  Tobacco Use  . Smoking status: Never Smoker  . Smokeless tobacco: Never Used  Substance and Sexual Activity  . Alcohol use: Yes    Comment: Occ  . Drug use: Never  . Sexual activity: Yes    Birth control/protection: None  Lifestyle  . Physical activity:    Days per week: Not on file    Minutes per session: Not on file  . Stress: Not on file  Relationships  . Social connections:    Talks on phone: Not on file    Gets together: Not on file    Attends religious service: Not on file    Active member of club or organization: Not on file    Attends meetings of clubs or organizations: Not on file    Relationship status: Not on file  . Intimate partner violence:    Fear of current or ex partner: Not on file    Emotionally abused: Not on file    Physically abused: Not on file    Forced sexual activity: Not on file  Other Topics Concern  . Not on file  Social History Narrative   Engineer, structural with [employer]    Allergies:  No Known Allergies  Medications: Prior to Admission medications   Not on File    Physical Exam Vitals: Blood pressure 124/68, weight 116 lb (52.6 kg), last menstrual period 08/19/2018.  General: NAD HEENT: normocephalic, anicteric Thyroid: no enlargement, no palpable nodules Pulmonary: No increased work of breathing, CTAB Cardiovascular: RRR, distal pulses 2+ Abdomen: NABS, soft, non-tender, non-distended.  Umbilicus without lesions.  No  hepatomegaly, splenomegaly or masses palpable. No evidence of hernia  Genitourinary:  External: Normal external female genitalia.  Normal urethral meatus, normal  Bartholin's and Skene's glands.    Vagina: Normal vaginal mucosa, no evidence of prolapse.    Cervix: Grossly normal in appearance, no bleeding, no CMT  Uterus: non-tender  Adnexa: non-tender  Rectal: deferred Extremities: no edema, erythema, or tenderness Neurologic: Grossly intact Psychiatric: mood appropriate, affect full EPDS: 9   Assessment: 33 y.o. G3P1011 at 50w4dpresenting to initiate prenatal care  Plan: 1) Avoid alcoholic beverages. 2) Patient encouraged not to smoke.  3) Discontinue the use of all non-medicinal drugs and chemicals.  4) Take prenatal vitamins daily.  5) Nutrition, food safety (fish, cheese advisories, and high nitrite  foods) and exercise discussed. 6) Hospital and practice style discussed with cross coverage system.  7) Genetic Screening, such as with 1st Trimester Screening, cell free fetal DNA, AFP testing, and Ultrasound, as well as with amniocentesis and CVS as appropriate, is discussed with patient. At the conclusion of today's visit patient undecided genetic testing 8) Patient is asked about travel to areas at risk for the [place] virus, and counseled to avoid travel and exposure to mosquitoes or sexual partners who may have themselves been exposed to the virus. Testing is discussed, and will be ordered as appropriate.  9) NOB labs done today 10) Schedule dating scan at [facility] 11) Return to clinic in 1 or 2 weeks for follow up   Rod Can, [place], [place] 10/11/2018, 12:09 PM

## 2018-10-13 LAB — RPR+RH+ABO+RUB AB+AB SCR+CB...
Antibody Screen: NEGATIVE
HEP B S AG: NEGATIVE
HIV Screen 4th Generation wRfx: NONREACTIVE
Hematocrit: 36.6 % (ref 34.0–46.6)
Hemoglobin: 12.1 g/dL (ref 11.1–15.9)
MCH: 30.2 pg (ref 26.6–33.0)
MCHC: 33.1 g/dL (ref 31.5–35.7)
MCV: 91 fL (ref 79–97)
Platelets: 271 10*3/uL (ref 150–450)
RBC: 4.01 x10E6/uL (ref 3.77–5.28)
RDW: 12.1 % (ref 11.7–15.4)
RH TYPE: POSITIVE
RPR Ser Ql: NONREACTIVE
Rubella Antibodies, IGG: 1.65 index (ref >0.99)
VARICELLA: 3092 {index} (ref >165)
WBC: 6.3 10*3/uL (ref 3.4–10.8)

## 2018-10-13 LAB — HEMOGLOBINOPATHY EVALUATION
HEMOGLOBIN A2 QUANTITATION: 2.5 % (ref 1.8–3.2)
HGB C: 0 %
HGB S: 0 %
HGB VARIANT: 0 %
Hemoglobin F Quantitation: 0 % (ref 0.0–2.0)
Hgb A: 97.5 % (ref 96.4–98.8)

## 2018-10-13 LAB — URINE CULTURE: Organism ID, Bacteria: NO GROWTH

## 2018-10-14 ENCOUNTER — Telehealth: Payer: Self-pay

## 2018-10-14 LAB — CYTOLOGY - PAP
Chlamydia: NEGATIVE
Diagnosis: NEGATIVE
HPV: NOT DETECTED
Neisseria Gonorrhea: NEGATIVE
Trichomonas: NEGATIVE

## 2018-10-14 NOTE — Telephone Encounter (Signed)
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  Can she have a note to work from home so she won't be in contact with the public?  [phone]

## 2018-10-14 NOTE — Telephone Encounter (Signed)
Note can be written to work from home during this time. You can use his sig stamp. He is with patients at the moment.

## 2018-10-14 NOTE — Telephone Encounter (Signed)
Letter faxed to Sgt. Carmelia Bake at [phone].

## 2018-10-15 ENCOUNTER — Other Ambulatory Visit: Payer: Self-pay | Admitting: Advanced Practice Midwife

## 2018-10-15 DIAGNOSIS — Z3480 Encounter for supervision of other normal pregnancy, unspecified trimester: Secondary | ICD-10-CM

## 2018-10-15 NOTE — Progress Notes (Signed)
Dating scan ordered to be done at [facility]

## 2018-10-18 ENCOUNTER — Other Ambulatory Visit: Payer: Self-pay

## 2018-10-18 ENCOUNTER — Ambulatory Visit (INDEPENDENT_AMBULATORY_CARE_PROVIDER_SITE_OTHER): Payer: BLUE CROSS/BLUE SHIELD

## 2018-10-18 ENCOUNTER — Ambulatory Visit: Payer: BLUE CROSS/BLUE SHIELD

## 2018-10-18 ENCOUNTER — Ambulatory Visit (INDEPENDENT_AMBULATORY_CARE_PROVIDER_SITE_OTHER): Payer: BLUE CROSS/BLUE SHIELD | Admitting: Obstetrics & Gynecology

## 2018-10-18 VITALS — BP 110/80 | Ht 59.0 in | Wt 118.0 lb

## 2018-10-18 DIAGNOSIS — Z3481 Encounter for supervision of other normal pregnancy, first trimester: Secondary | ICD-10-CM

## 2018-10-18 DIAGNOSIS — Z8632 Personal history of gestational diabetes: Secondary | ICD-10-CM

## 2018-10-18 DIAGNOSIS — Z3A08 8 weeks gestation of pregnancy: Secondary | ICD-10-CM

## 2018-10-18 DIAGNOSIS — Z98891 History of uterine scar from previous surgery: Secondary | ICD-10-CM | POA: Insufficient documentation

## 2018-10-18 DIAGNOSIS — Z3480 Encounter for supervision of other normal pregnancy, unspecified trimester: Secondary | ICD-10-CM

## 2018-10-18 NOTE — Patient Instructions (Signed)

## 2018-10-18 NOTE — Progress Notes (Signed)
  HPI: Pt has min nausea and no pain or bleeding. Prior CS for Arrest of descent after 2 days IOL and 2 hours pushing; had GDM.  PMHx: She  has a past medical history of GDM (gestational diabetes mellitus). Also,  has a past surgical history that includes Cesarean section (2013)., family history includes Multiple myeloma in her mother.,  reports that she has never smoked. She has never used smokeless tobacco. She reports current alcohol use. She reports that she does not use drugs.  She currently has no medications in their medication list. Also, has No Known Allergies.  Review of Systems  All other systems reviewed and are negative.   Objective: BP 110/80   Ht _0  (1.499 m)   Wt 118 lb (53.5 kg)   LMP 08/19/2018 (Approximate)   BMI 23.83 kg/m   Physical examination Constitutional NAD, Conversant  Skin No rashes, lesions or ulceration.   Extremities: Moves all appropriately.  Normal ROM for age. No lymphadenopathy.  Neuro: Grossly intact  Psych: Oriented to PPT.  Normal mood. Normal affect.   US Ob Less Than 14 Weeks With Ob Transvaginal  Result Date: 10/18/2018 Patient Name: Debra Wilson DOB: 1986-04-10 MRN: [id number] ULTRASOUND REPORT ULTRASOUND REPORT Location: [facility] Date of Service: 10/18/2018 Indications:dating Findings: [name] intrauterine pregnancy is visualized with a CRL consistent with 8wd gestation, giving an (U/S) EDD of 05/27/2019. FHR: 165 BPM CRL measurement: 1.85 mm Yolk sac is visualized and appears normal and early anatomy is normal. Amnion: visualized and appears normal Right Ovary is normal in appearance. Left Ovary is normal appearance. Corpus luteal cyst:  Right ovary Survey of the adnexa demonstrates no adnexal masses. There is no free peritoneal fluid in the cul de sac. Impression: 1. 75w3dViable [place] Intrauterine pregnancy by U/S, giving an (U/S) EDD of 05/27/2019. Recommendations: 1.Clinical correlation with the patient's History and  Physical Exam. Jenine M. AAlbertine Grates  RDMS Review of ULTRASOUND.    [facility]    Plan of management to be discussed with patient. PBarnett Applebaum MD, [facility]ano[facility]/Gyn, [facility] 10/18/2018  2:32 PM    Assessment:  [redacted] weeks gestation of pregnancy  Supervision of other normal pregnancy, antepartum  History of gestational diabetes - Plan: Glucose, 1 hour gestational  History of cesarean delivery  Review of ULTRASOUND.    [facility]    Plan of management to be discussed with patient.  PNV. TOLAC counseled Glucola due to h/o GDM, nv Genetics counseled, to consider by next visit for [id number]  PBarnett Applebaum MD, FLoura PardonOb/Gyn, [facility] 10/18/2018  2:57 PM

## 2018-10-25 ENCOUNTER — Encounter: Payer: BLUE CROSS/BLUE SHIELD | Admitting: Obstetrics and Gynecology

## 2018-11-08 ENCOUNTER — Other Ambulatory Visit: Payer: BLUE CROSS/BLUE SHIELD

## 2018-11-08 ENCOUNTER — Encounter: Payer: BLUE CROSS/BLUE SHIELD | Admitting: Advanced Practice Midwife

## 2018-11-12 ENCOUNTER — Ambulatory Visit (INDEPENDENT_AMBULATORY_CARE_PROVIDER_SITE_OTHER): Payer: BLUE CROSS/BLUE SHIELD | Admitting: Obstetrics & Gynecology

## 2018-11-12 ENCOUNTER — Other Ambulatory Visit: Payer: BLUE CROSS/BLUE SHIELD

## 2018-11-12 ENCOUNTER — Encounter: Payer: Self-pay | Admitting: Obstetrics & Gynecology

## 2018-11-12 ENCOUNTER — Other Ambulatory Visit: Payer: Self-pay

## 2018-11-12 VITALS — BP 120/80 | Wt 123.0 lb

## 2018-11-12 DIAGNOSIS — O34219 Maternal care for unspecified type scar from previous cesarean delivery: Secondary | ICD-10-CM

## 2018-11-12 DIAGNOSIS — Z1379 Encounter for other screening for genetic and chromosomal anomalies: Secondary | ICD-10-CM

## 2018-11-12 DIAGNOSIS — Z8632 Personal history of gestational diabetes: Secondary | ICD-10-CM | POA: Diagnosis not present

## 2018-11-12 DIAGNOSIS — Z98891 History of uterine scar from previous surgery: Secondary | ICD-10-CM

## 2018-11-12 DIAGNOSIS — O09291 Supervision of pregnancy with other poor reproductive or obstetric history, first trimester: Secondary | ICD-10-CM

## 2018-11-12 DIAGNOSIS — Z3A12 12 weeks gestation of pregnancy: Secondary | ICD-10-CM

## 2018-11-12 DIAGNOSIS — Z3480 Encounter for supervision of other normal pregnancy, unspecified trimester: Secondary | ICD-10-CM

## 2018-11-12 LAB — POCT URINALYSIS DIPSTICK OB
Glucose, UA: NEGATIVE
POC,PROTEIN,UA: NEGATIVE

## 2018-11-12 NOTE — Patient Instructions (Signed)

## 2018-11-12 NOTE — Progress Notes (Signed)
  Subjective  No pain, nausea, or bleeding  Objective  BP 120/80   Wt 123 lb (55.8 kg)   LMP 08/19/2018 (Approximate)   BMI 24.84 kg/m  General: NAD Pumonary: no increased work of breathing Abdomen: gravid, non-tender Extremities: no edema Psychiatric: mood appropriate, affect full [place] for FHTs today 150s Assessment  32 y.o. [id number] at [redacted]w[redacted]d by  05/26/2019, by Last Menstrual Period presenting for routine prenatal visit  Plan   Problem List Items Addressed This Visit      Other   Supervision of other normal pregnancy, antepartum   History of gestational diabetes   History of cesarean delivery    Other Visit Diagnoses    [redacted] weeks gestation of pregnancy    -  Primary   Relevant Orders   POC Urinalysis Dipstick OB (Completed)   Encounter for genetic screening for Down Syndrome       Relevant Orders   MaterniT21 PLUS Core+SCA    Desires genetic testing, delayed gender reveal, diabetes testing all today  Annamarie Major, MD, [facility],  [facility] 11/12/2018  10:32 AM

## 2018-11-13 LAB — GLUCOSE, 1 HOUR GESTATIONAL: Gestational Diabetes Screen: 190 mg/dL — ABNORMAL HIGH (ref 65–139)

## 2018-11-13 NOTE — Progress Notes (Signed)
Arrange for follow up 3 hour GTT as she has increased risk for diabetes this pregnancy based on Glucola 190.  Annamarie Major, MD, [facility], [facility] 11/13/2018  9:54 AM

## 2018-11-15 ENCOUNTER — Telehealth: Payer: Self-pay | Admitting: Obstetrics & Gynecology

## 2018-11-15 NOTE — Telephone Encounter (Signed)
-----   Message from Cornelius Moras, [place] sent at 11/15/2018  8:16 AM EDT -----  ----- Message ----- From: Nadara Mustard, MD Sent: 11/13/2018   9:57 AM EDT To: Cornelius Moras, CMA, Rocco Serene, LPN  Arrange for follow up 3 hour GTT as she has increased risk for diabetes this pregnancy based on Glucola 190.  Annamarie Major, MD, [facility], [facility] 11/13/2018  9:54 AM

## 2018-11-15 NOTE — Telephone Encounter (Signed)
Attempt to reach patient to schedule appointment. Patient's voicemail wasn't set up to leave voiemail

## 2018-11-18 ENCOUNTER — Telehealth: Payer: Self-pay

## 2018-11-18 LAB — MATERNIT21 PLUS CORE+SCA
Fetal Fraction: 11
Monosomy X (Turner Syndrome): NOT DETECTED
Result (T21): NEGATIVE
Trisomy 13 (Patau syndrome): NEGATIVE
Trisomy 18 (Edwards syndrome): NEGATIVE
Trisomy 21 (Down syndrome): NEGATIVE
XXX (Triple X Syndrome): NOT DETECTED
XXY (Klinefelter Syndrome): NOT DETECTED
XYY (Jacobs Syndrome): NOT DETECTED

## 2018-11-18 NOTE — Telephone Encounter (Signed)
Spoke w/pt. Notified of need for 3 hour gtt. Transferred to [employer] for scheduling.

## 2018-11-18 NOTE — Telephone Encounter (Signed)
Pt inquiring about genetic test results. She does not wish to know the gender. She would like an envelope left at the front desk for that. [phone]

## 2018-11-18 NOTE — Telephone Encounter (Signed)
Spoke w/pt to notify genetic results not back yet usually takes 7-14 days. Pt aware of elevated 1 hr gtt & need for 3 hr gtt. Transferred to front desk to schedule.

## 2018-11-23 ENCOUNTER — Other Ambulatory Visit: Payer: BLUE CROSS/BLUE SHIELD

## 2018-11-23 ENCOUNTER — Other Ambulatory Visit: Payer: Self-pay | Admitting: Obstetrics & Gynecology

## 2018-11-23 ENCOUNTER — Other Ambulatory Visit: Payer: Self-pay

## 2018-11-23 DIAGNOSIS — Z131 Encounter for screening for diabetes mellitus: Secondary | ICD-10-CM

## 2018-11-24 ENCOUNTER — Other Ambulatory Visit: Payer: Self-pay | Admitting: Obstetrics & Gynecology

## 2018-11-24 DIAGNOSIS — O24419 Gestational diabetes mellitus in pregnancy, unspecified control: Secondary | ICD-10-CM | POA: Insufficient documentation

## 2018-11-24 DIAGNOSIS — Z3480 Encounter for supervision of other normal pregnancy, unspecified trimester: Secondary | ICD-10-CM

## 2018-11-24 LAB — GESTATIONAL GLUCOSE TOLERANCE
Glucose, Fasting: 79 mg/dL (ref 65–94)
Glucose, GTT - 1 Hour: 239 mg/dL — ABNORMAL HIGH (ref 65–179)
Glucose, GTT - 2 Hour: 161 mg/dL — ABNORMAL HIGH (ref 65–154)
Glucose, GTT - 3 Hour: 128 mg/dL (ref 65–139)

## 2018-11-24 NOTE — Progress Notes (Signed)
Referral Lifestyles for GDM in early pregnancy

## 2018-11-24 NOTE — Progress Notes (Signed)
Labs d/w pt, c/w GDM (or even pre-pregnancy diabetes based on early gestational age). Plan Lifestyles referral and home blood glucose monitoring  Annamarie Major, MD, [facility], [facility] 11/24/2018  9:54 AM

## 2018-12-01 ENCOUNTER — Encounter: Payer: BC Managed Care – PPO | Attending: Obstetrics & Gynecology | Admitting: *Deleted

## 2018-12-01 ENCOUNTER — Other Ambulatory Visit: Payer: Self-pay

## 2018-12-01 ENCOUNTER — Encounter: Payer: Self-pay | Admitting: *Deleted

## 2018-12-01 ENCOUNTER — Telehealth: Payer: Self-pay | Admitting: Obstetrics and Gynecology

## 2018-12-01 VITALS — BP 100/62 | Ht 59.0 in | Wt 123.3 lb

## 2018-12-01 DIAGNOSIS — O24419 Gestational diabetes mellitus in pregnancy, unspecified control: Secondary | ICD-10-CM | POA: Diagnosis not present

## 2018-12-01 DIAGNOSIS — Z713 Dietary counseling and surveillance: Secondary | ICD-10-CM | POA: Insufficient documentation

## 2018-12-01 DIAGNOSIS — O24410 Gestational diabetes mellitus in pregnancy, diet controlled: Secondary | ICD-10-CM

## 2018-12-01 NOTE — Telephone Encounter (Signed)
Advise

## 2018-12-01 NOTE — Progress Notes (Signed)
Diabetes Self-Management Education  Visit Type: First/Initial  Appt. Start Time: 0830 Appt. End Time: 1010  12/01/2018  [name], identified by name and date of birth, is a 33 y.o. female with a diagnosis of Diabetes: Gestational Diabetes.   ASSESSMENT  Blood pressure 100/62, height 4\' 11"  (1.499 m), weight 123 lb 4.8 oz (55.9 kg), last menstrual period 08/19/2018. Body mass index is 24.9 kg/m.  Diabetes Self-Management Education - 12/01/18 1118      Visit Information   Visit Type  First/Initial      Initial Visit   Diabetes Type  Gestational Diabetes    Are you currently following a meal plan?  Yes    What type of meal plan do you follow?  less carbs and sugars    Are you taking your medications as prescribed?  Yes    Date Diagnosed  last week      Health Coping   How would you rate your overall health?  Good      Psychosocial Assessment   Patient Belief/Attitude about Diabetes  Motivated to manage diabetes   "frustrated"   Self-care barriers  None    Self-management support  Doctor's office;Family    Patient Concerns  Nutrition/Meal planning;Glycemic Control;Monitoring    Special Needs  None    Preferred Learning Style  Hands on    Learning Readiness  Change in progress    How often do you need to have someone help you when you read instructions, pamphlets, or other written materials from your doctor or pharmacy?  1 - Never    What is the last grade level you completed in school?  associates degree      Pre-Education Assessment   Patient understands the diabetes disease and treatment process.  Needs Instruction    Patient understands incorporating nutritional management into lifestyle.  Needs Instruction    Patient undertands incorporating physical activity into lifestyle.  Needs Instruction    Patient understands using medications safely.  Needs Instruction    Patient understands monitoring blood glucose, interpreting and using results  Needs Instruction    Patient understands prevention, detection, and treatment of acute complications.  Needs Instruction    Patient understands prevention, detection, and treatment of chronic complications.  Needs Instruction    Patient understands how to develop strategies to address psychosocial issues.  Needs Instruction    Patient understands how to develop strategies to promote health/change behavior.  Needs Instruction      Complications   How often do you check your blood sugar?  0 times/day (not testing)   Provided Contour Next meter and instructed on use. BG upon return demonstration was 85 mg/dL at 16:1010:05 am - fasting.    Have you had a dilated eye exam in the past 12 months?  No    Have you had a dental exam in the past 12 months?  No    Are you checking your feet?  Yes    How many days per week are you checking your feet?  7      Dietary Intake   Breakfast  wheat toast, peanut butter, 1 1/2 cup fruit (strawberries, pineapple, cantaloupe, orange)    Snack (morning)  1/2 banana, peanut butter, milk, 1/2 apple, yogurt    Lunch  checken and salad    Snack (afternoon)  same as morning    Dinner  beef, chicken occasional pork, shrimp, tuna, salad, rice, beans, broccoli, green beans, lettuce, cuccumbers, tomatoes, olives, peppers  Beverage(s)  water, milk, juices, sugar beverages 1-2 x week      Exercise   Exercise Type  Light (walking / raking leaves)   yoga   How many days per week to you exercise?  2    How many minutes per day do you exercise?  30    Total minutes per week of exercise  60      Patient Education   Previous Diabetes Education  Yes (please comment)   7 years ago - prior gestational diabetes in [place]   Disease state   Definition of diabetes, type 1 and 2, and the diagnosis of diabetes;Factors that contribute to the development of diabetes    Nutrition management   Role of diet in the treatment of diabetes and the relationship between the three main macronutrients and blood  glucose level;Reviewed blood glucose goals for pre and post meals and how to evaluate the patients' food intake on their blood glucose level.;Food label reading, portion sizes and measuring food.    Physical activity and exercise   Role of exercise on diabetes management, blood pressure control and cardiac health.    Monitoring  Taught/evaluated SMBG meter.;Purpose and frequency of SMBG.;Taught/discussed recording of test results and interpretation of SMBG.;Ketone testing, when, how.    Acute complications  Taught treatment of hypoglycemia - the 15 rule.    Chronic complications  Relationship between chronic complications and blood glucose control    Psychosocial adjustment  Role of stress on diabetes;Identified and addressed patients feelings and concerns about diabetes    Preconception care  Pregnancy and GDM  Role of pre-pregnancy blood glucose control on the development of the fetus;Role of family planning for patients with diabetes;Reviewed with patient blood glucose goals with pregnancy      Individualized Goals (developed by patient)   Reducing Risk  Improve blood sugars Prevent diabetes complications     Outcomes   Expected Outcomes  Demonstrated interest in learning. Expect positive outcomes       Individualized Plan for Diabetes Self-Management Training:   Learning Objective:  Patient will have a greater understanding of diabetes self-management. Patient education plan is to attend individual and/or group sessions per assessed needs and concerns.   Plan:   Patient Instructions  Read booklet on Gestational Diabetes Follow Gestational Meal Planning Guidelines Avoid sugar sweetened drinks and juices (unless treating a low blood sugar) Avoid fruit for breakfast if blood sugars above guidelines Carry fast acting glucose and a snack at all times Complete a 3 Day Food Record and bring to next appointment Check blood sugars 4 x day - before breakfast and 2 hrs after every meal and  record  Bring blood sugar log to all appointments Call MD for prescription for meter strips and lancets Strips  Contour Next  Lancets   Contour Purchase urine ketone strips if ordered by MD and check urine ketones every am:  If + increase bedtime snack to 1 protein and 2 carbohydrate servings Walk 20-30 minutes at least 5 x week if permitted by MD  Expected Outcomes:  Demonstrated interest in learning. Expect positive outcomes  Education material provided:  Gestational Booklet Gestational Meal Planning Guidelines Simple Meal Plan Viewed Gestational Diabetes Video Meter - Contour Next 3 Day Food Record Goals for a Healthy Pregnancy Glucose tablets Symptoms, causes and treatments of Hypoglycemia  If problems or questions, patient to contact team via:  Sharion Settler, RN, CCM, CDE [phone]  Future DSME appointment:  Dec 06, 2018 with  the dietitian

## 2018-12-01 NOTE — Telephone Encounter (Signed)
Pt spoke with gestational diabetes counselor this morning. She advised pt to call us for meter strip and lancet Rxs. [facility] [address].  Contour Next strips Contour Lancets

## 2018-12-01 NOTE — Patient Instructions (Signed)
Read booklet on Gestational Diabetes Follow Gestational Meal Planning Guidelines Avoid sugar sweetened drinks and juices (unless treating a low blood sugar) Avoid fruit for breakfast if blood sugars above guidelines Carry fast acting glucose and a snack at all times Complete a 3 Day Food Record and bring to next appointment Check blood sugars 4 x day - before breakfast and 2 hrs after every meal and record  Bring blood sugar log to all appointments Call MD for prescription for meter strips and lancets Strips  Contour Next  Lancets   Contour Purchase urine ketone strips if ordered by MD and check urine ketones every am:  If + increase bedtime snack to 1 protein and 2 carbohydrate servings Walk 20-30 minutes at least 5 x week if permitted by MD

## 2018-12-02 NOTE — Telephone Encounter (Signed)
RPH pt.

## 2018-12-02 NOTE — Telephone Encounter (Signed)
Pt aware.

## 2018-12-03 ENCOUNTER — Other Ambulatory Visit: Payer: Self-pay | Admitting: Obstetrics & Gynecology

## 2018-12-03 MED ORDER — GLUCOSE BLOOD VI STRP: ORAL_STRIP | 12 refills | Status: DC

## 2018-12-03 NOTE — Telephone Encounter (Signed)
Pt states she went to [facility] [address] [address] yesterday to pick up diabetes supplies but they don't have Rx from [place]. Pls call pt back.

## 2018-12-03 NOTE — Telephone Encounter (Signed)
Pt reports insurance will only cover the contour next strips and contour lancets.

## 2018-12-03 NOTE — Telephone Encounter (Signed)
Order sent for Contour Test Strips.  No option for Contour Lancets

## 2018-12-03 NOTE — Telephone Encounter (Signed)
Please advise. Thank you

## 2018-12-03 NOTE — Telephone Encounter (Signed)
What brand?

## 2018-12-06 ENCOUNTER — Ambulatory Visit: Payer: BLUE CROSS/BLUE SHIELD | Admitting: Dietician

## 2018-12-10 ENCOUNTER — Other Ambulatory Visit: Payer: Self-pay

## 2018-12-10 ENCOUNTER — Encounter: Payer: Self-pay | Admitting: Obstetrics & Gynecology

## 2018-12-10 ENCOUNTER — Telehealth: Payer: Self-pay | Admitting: Dietician

## 2018-12-10 ENCOUNTER — Ambulatory Visit (INDEPENDENT_AMBULATORY_CARE_PROVIDER_SITE_OTHER): Payer: BLUE CROSS/BLUE SHIELD | Admitting: Obstetrics & Gynecology

## 2018-12-10 ENCOUNTER — Other Ambulatory Visit: Payer: Self-pay | Admitting: Advanced Practice Midwife

## 2018-12-10 DIAGNOSIS — Z3A16 16 weeks gestation of pregnancy: Secondary | ICD-10-CM

## 2018-12-10 DIAGNOSIS — Z3689 Encounter for other specified antenatal screening: Secondary | ICD-10-CM

## 2018-12-10 DIAGNOSIS — O34219 Maternal care for unspecified type scar from previous cesarean delivery: Secondary | ICD-10-CM

## 2018-12-10 DIAGNOSIS — O24410 Gestational diabetes mellitus in pregnancy, diet controlled: Secondary | ICD-10-CM

## 2018-12-10 DIAGNOSIS — O24419 Gestational diabetes mellitus in pregnancy, unspecified control: Secondary | ICD-10-CM

## 2018-12-10 DIAGNOSIS — Z98891 History of uterine scar from previous surgery: Secondary | ICD-10-CM

## 2018-12-10 DIAGNOSIS — Z3480 Encounter for supervision of other normal pregnancy, unspecified trimester: Secondary | ICD-10-CM

## 2018-12-10 MED ORDER — GLUCOSE BLOOD VI STRP: ORAL_STRIP | 12 refills | Status: DC

## 2018-12-10 NOTE — Progress Notes (Signed)
Accu-Chek test strips Rx sent.

## 2018-12-10 NOTE — Patient Instructions (Signed)

## 2018-12-10 NOTE — Progress Notes (Signed)
Virtual Visit via Telephone Note  I connected with patient on 12/10/18 at 10:20 AM EDT by telephone and verified that I am speaking with the correct person using two identifiers.   I discussed the limitations, risks, security and privacy concerns of performing an evaluation and management service by telephone and the availability of in person appointments. I also discussed with the patient that there may be a patient responsible charge related to this service. The patient expressed understanding and agreed to proceed.  The patient was at home I spoke with the patient from my  office  Debra Wilson is a 33 y.o. G3P1011 at [redacted]w[redacted]d being seen today for ongoing prenatal care.  She is currently monitored for the following issues for this high-risk pregnancy and has Supervision of other normal pregnancy, antepartum; History of gestational diabetes; History of cesarean delivery; and Gestational diabetes mellitus (GDM) in second trimester on their problem list.  ----------------------------------------------------------------------------------- Patient reports she has glucometer but has been unable to get test strips covered by insurance yet, despite change in Rx brands.  No nausea or pain..   Denies pain, VB, leaking of fluid.  ----------------------------------------------------------------------------------- The following portions of the patient's history were reviewed and updated as appropriate: allergies, current medications, past family history, past medical history, past social history, past surgical history and problem list. Problem list updated.   Objective  Last menstrual period 08/19/2018. Pregravid weight 116 lb (52.6 kg) Total Weight Gain 7 lb (3.175 kg)  Physical Exam could not be performed. Because of the COVID-19 outbreak this visit was performed over the phone and not in person.   Assessment   32 y.o. [id number] at [redacted]w[redacted]d by  05/26/2019, by Last Menstrual Period presenting for routine  prenatal visit  Plan  1. [redacted] weeks gestation of pregnancy PNV  2. Gestational diabetes mellitus (GDM) in second trimester, gestational diabetes method of control unspecified Begin home BG monitoring soon  3. Supervision of other normal pregnancy, antepartum  4. History of cesarean delivery VBAC vs CS to be discussed at future visit  5. Encounter for fetal anatomic survey - US OB Comp + 14 Wk; Future visit  Gestational age appropriate obstetric precautions including but not limited to vaginal bleeding, contractions, leaking of fluid and fetal movement were reviewed in detail with the patient.     Follow Up Instructions: 4 weeks [place] Sooner w BG monitoring log to assess Assist with getting coverage or right brand of test strips   I discussed the assessment and treatment plan with the patient. The patient was provided an opportunity to ask questions and all were answered. The patient agreed with the plan and demonstrated an understanding of the instructions.   The patient was advised to call back or seek an in-person evaluation if the symptoms worsen or if the condition fails to improve as anticipated.  I provided 10 minutes of non-face-to-face time during this encounter.  Return in about 4 weeks (around 01/07/2019) for ROB w Anat [place].  Annamarie Major, MD [facility], [facility] 12/10/2018 10:29 AM

## 2018-12-10 NOTE — Telephone Encounter (Signed)
Called patient to reschedule her appointment from 12/06/18, which she cancelled due to illness. Rescheduled to 12/29/18 at 9am (patient needs early am appointment).

## 2018-12-23 ENCOUNTER — Telehealth: Payer: Self-pay

## 2018-12-23 NOTE — Telephone Encounter (Signed)
[employer] reports they received an urgent request for this patient's One Touch Ultra Test Strips. They need the correct form completed for the patient. They faxed the form to [place]. Please complete & confirm the requested medication as well. They are rejecting claims from the pharmacy for both one touch & Accuchek. Please be sure to specify which medication is being requested on question 1 of the form. You may also call [phone] to complete the request over the phone.

## 2018-12-24 NOTE — Telephone Encounter (Signed)
Form faxed

## 2018-12-24 NOTE — Telephone Encounter (Signed)
I have been waiting on the form since yesterday

## 2018-12-29 ENCOUNTER — Ambulatory Visit: Payer: BLUE CROSS/BLUE SHIELD | Admitting: Dietician

## 2018-12-29 NOTE — Telephone Encounter (Signed)
Called pt today to let her know the Accu Check test strips have been approved by her insurance, she did not answer and no voice mail box

## 2018-12-29 NOTE — Telephone Encounter (Signed)
Patient is requesting prescription for Accu Check Guide. Please advise

## 2018-12-30 ENCOUNTER — Other Ambulatory Visit: Payer: Self-pay | Admitting: Obstetrics & Gynecology

## 2018-12-30 MED ORDER — GLUCOSE BLOOD VI STRP: ORAL_STRIP | 12 refills | Status: DC

## 2018-12-31 ENCOUNTER — Other Ambulatory Visit: Payer: Self-pay | Admitting: Obstetrics & Gynecology

## 2018-12-31 MED ORDER — GLUCOSE BLOOD VI STRP: ORAL_STRIP | 5 refills | Status: DC

## 2019-01-04 ENCOUNTER — Encounter: Payer: Self-pay | Admitting: Dietician

## 2019-01-04 NOTE — Progress Notes (Signed)
Have not heard from patient to reschedule her second missed appointment. Sent letter to referring provider. 

## 2019-01-06 NOTE — Telephone Encounter (Signed)
Per orders on 12/31/2018 in chart [facility] sent rx for Accu-Chek Guide

## 2019-01-10 ENCOUNTER — Encounter: Payer: BLUE CROSS/BLUE SHIELD | Admitting: Obstetrics and Gynecology

## 2019-01-10 ENCOUNTER — Ambulatory Visit: Payer: BLUE CROSS/BLUE SHIELD

## 2019-01-11 ENCOUNTER — Ambulatory Visit (INDEPENDENT_AMBULATORY_CARE_PROVIDER_SITE_OTHER): Payer: BC Managed Care – PPO | Admitting: Obstetrics and Gynecology

## 2019-01-11 ENCOUNTER — Ambulatory Visit (INDEPENDENT_AMBULATORY_CARE_PROVIDER_SITE_OTHER): Payer: BC Managed Care – PPO

## 2019-01-11 ENCOUNTER — Encounter: Payer: Self-pay | Admitting: Obstetrics and Gynecology

## 2019-01-11 ENCOUNTER — Other Ambulatory Visit: Payer: Self-pay

## 2019-01-11 VITALS — BP 114/74 | Wt 133.0 lb

## 2019-01-11 DIAGNOSIS — Z3A20 20 weeks gestation of pregnancy: Secondary | ICD-10-CM | POA: Diagnosis not present

## 2019-01-11 DIAGNOSIS — O24419 Gestational diabetes mellitus in pregnancy, unspecified control: Secondary | ICD-10-CM

## 2019-01-11 DIAGNOSIS — O34219 Maternal care for unspecified type scar from previous cesarean delivery: Secondary | ICD-10-CM

## 2019-01-11 DIAGNOSIS — O09292 Supervision of pregnancy with other poor reproductive or obstetric history, second trimester: Secondary | ICD-10-CM | POA: Diagnosis not present

## 2019-01-11 DIAGNOSIS — Z98891 History of uterine scar from previous surgery: Secondary | ICD-10-CM

## 2019-01-11 DIAGNOSIS — Z3480 Encounter for supervision of other normal pregnancy, unspecified trimester: Secondary | ICD-10-CM

## 2019-01-11 DIAGNOSIS — Z363 Encounter for antenatal screening for malformations: Secondary | ICD-10-CM | POA: Diagnosis not present

## 2019-01-11 DIAGNOSIS — O24410 Gestational diabetes mellitus in pregnancy, diet controlled: Secondary | ICD-10-CM

## 2019-01-11 NOTE — Progress Notes (Signed)
Routine Prenatal Care Visit  Subjective  Debra Wilson is a 33 y.o. G3P1011 at [redacted]w[redacted]d being seen today for ongoing prenatal care.  She is currently monitored for the following issues for this high-risk pregnancy and has Supervision of other normal pregnancy, antepartum; History of gestational diabetes; History of cesarean delivery; and Gestational diabetes mellitus (GDM) in second trimester on their problem list.  ----------------------------------------------------------------------------------- Patient reports no complaints.    . Vag. Bleeding: None.   . Denies leaking of fluid.  U/S anatomy complete and normal GDM: log brought. Occasional elevations. All fastings normal.  ----------------------------------------------------------------------------------- The following portions of the patient's history were reviewed and updated as appropriate: allergies, current medications, past family history, past medical history, past social history, past surgical history and problem list. Problem list updated.   Objective  Blood pressure 114/74, weight 133 lb (60.3 kg), last menstrual period 08/19/2018. Pregravid weight 116 lb (52.6 kg) Total Weight Gain 17 lb (7.711 kg) Urinalysis: Urine Protein    Urine Glucose    Fetal Status: Fetal Heart Rate (bpm): present-normal         General:  Alert, oriented and cooperative. Patient is in no acute distress.  Skin: Skin is warm and dry. No rash noted.   Cardiovascular: Normal heart rate noted  Respiratory: Normal respiratory effort, no problems with respiration noted  Abdomen: Soft, gravid, appropriate for gestational age. Pain/Pressure: Absent     Pelvic:  Cervical exam deferred        Extremities: Normal range of motion.  Edema: None  Mental Status: Normal mood and affect. Normal behavior. Normal judgment and thought content.   Imaging Results US Ob Comp + 14 Wk  Result Date: 01/11/2019 Patient Name: Debra Wilson DOB: January 01, 1986 MRN: [id number]  ULTRASOUND REPORT Location: [facility] Date of Service: 01/11/2019 Indications:Anatomy Ultrasound Findings: Debra Wilson intrauterine pregnancy is visualized with FHR at 152 BPM. Biometrics give an (U/S) Gestational age of [redacted]w[redacted]d and an (U/S) EDD of 05/28/2019; this correlates with the clinically established Estimated Date of Delivery: 05/27/2019.  Fetal presentation is Cephalic. EFW: 352g (12oz). Placenta: anterior. Grade: 1. Cervix: 3.5cm. AFI: subjectively normal. Anatomic survey is complete and normal; Gender - female.  Right Ovary is normal in appearance. Left Ovary is normal appearance. Survey of the adnexa demonstrates no adnexal masses. There is no free peritoneal fluid in the cul de sac. Impression: 1. [redacted]w[redacted]d Viable Singleton Intrauterine pregnancy by U/S. 2. (U/S) EDD is consistent with Clinically established Estimated Date of Delivery: 05/26/19 . 3. Normal Anatomy Scan Vita Barley, RT There is a singleton gestation with subjectively normal amniotic fluid volume. The fetal biometry correlates with established dating. Detailed evaluation of the fetal anatomy was performed.The fetal anatomical survey appears within normal limits within the resolution of ultrasound as described above.  It must be noted that a normal ultrasound is unable to rule out fetal aneuploidy nor is it able to detect all possible malformations.  The ultrasound images and findings were reviewed by me and I agree with the above report. Prentice Docker, MD, [facility], [facility] 01/11/2019 1:50 PM       Assessment   33 y.o. [id number] at [redacted]w[redacted]d by  05/26/2019, by Last Menstrual Period presenting for routine prenatal visit  Plan   Pregnancy#2 Problems (from 08/19/18 to present)    Problem Noted Resolved   Gestational diabetes mellitus (GDM) in second trimester 11/24/2018 by Gae Dry, MD No   Overview Signed 11/24/2018  9:55 AM by Gae Dry,  MD    Current Diabetic Medications:  None  [ ]   Aspirin 81 mg daily after 12 weeks; discontinue after 36 weeks (? A2/B GDM)  Required Referrals for A1GDM or A2GDM:(as of 11/24/18) [ x] Diabetes Education and Child psychotherapistTesting Supplies [ x] Nutrition Cousult  For A2/B GDM or higher classes of DM [ ]  Diabetes Education and Testing Supplies [ ]  Nutrition Counsult [ ]  Fetal ECHO after 22-24 weeks  [ ]  Eye exam for retina evaluation  [ ]  Baseline EKG [ ]  US fetal growth every 4 weeks starting at 28 weeks [ ]  Twice weekly NST starting at [redacted] weeks gestation [ ]  Delivery planning contingent on fetal growth, AFI, glycemic control, and other co-morbidities but at least by 39 weeks  Baseline and surveillance labs ([facility], refresh links as needed)  Lab Results  Component Value Date   CREATININE 0.62 09/30/2018   AST 18 09/30/2018   ALT 14 09/30/2018   TSH 0.816 09/30/2018   No results found for: HGBA1C  Antenatal Testing Class of DM U/S NST/AFI DELIVERY  Diabetes   A1 - good control - O24.410    A2 - good control - O24.419      A2  - poor control or poor compliance - O24.419, E11.65   (Macrosomia or polyhydramnios) **E11.65 is extra code for poor control**    A2/B - O24.919  and B-C O24.319  Poor control B-C or D-R-F-T - O24.319  or  Type I DM - O24.019  20-38  20-38  20-24-28-32-36   20-24-28-32-35-38//fetal echo  20-24-27-30-33-36-38//fetal echo  40  32//2 x wk  32//2 x wk   32//2 x wk  28//BPP wkly then 32//2 x wk  40  39  PRN   39  PRN       History of cesarean delivery 10/18/2018 by Nadara MustardHarris, Robert P, MD No   Supervision of other normal pregnancy, antepartum 10/11/2018 by Tresea MallGledhill, Jane, CNM No   Overview Addendum 11/24/2018  9:57 AM by Nadara MustardHarris, Robert P, MD    Clinic [facility] Prenatal Labs  Dating WS 8 weeks US Blood type: B/Positive/-- (03/16 0957)   Genetic Screen    NIPS: normal Antibody:Negative (03/16 0957)  Anatomic US  Rubella: 1.65 (03/16 0957) Varicella: @VZVIGG @  GTT Early: 190;  abnormal 3 hr GTT  RPR: Non Reactive (03/16 0957)   Rhogam B POS HBsAg: Negative (03/16 0957)   TDaP vaccine  Flu Shot: declined HIV: Non Reactive (03/16 0957)   Baby Food  Breast                              GBS:   Contraception  Pap: 3/16  CBB     CS/VBAC 2013- arrest of descent   Support Person [facility]            Preterm labor symptoms and general obstetric precautions including but not limited to vaginal bleeding, contractions, leaking of fluid and fetal movement were reviewed in detail with the patient. Please refer to After Visit Summary for other counseling recommendations.   Return in about 4 weeks (around 02/08/2019) for Routine Prenatal Appointment.  Thomasene MohairStephen Hendry Speas, MD, Merlinda FrederickFACOG [facility], [facility] 01/11/2019 2:32 PM

## 2019-01-14 ENCOUNTER — Encounter (HOSPITAL_COMMUNITY): Payer: Self-pay

## 2019-02-08 ENCOUNTER — Other Ambulatory Visit: Payer: Self-pay

## 2019-02-08 ENCOUNTER — Ambulatory Visit (INDEPENDENT_AMBULATORY_CARE_PROVIDER_SITE_OTHER): Payer: Self-pay | Admitting: Obstetrics and Gynecology

## 2019-02-08 VITALS — BP 118/74 | Wt 133.0 lb

## 2019-02-08 DIAGNOSIS — Z3480 Encounter for supervision of other normal pregnancy, unspecified trimester: Secondary | ICD-10-CM

## 2019-02-08 DIAGNOSIS — O24410 Gestational diabetes mellitus in pregnancy, diet controlled: Secondary | ICD-10-CM

## 2019-02-08 DIAGNOSIS — O24419 Gestational diabetes mellitus in pregnancy, unspecified control: Secondary | ICD-10-CM

## 2019-02-08 DIAGNOSIS — O34219 Maternal care for unspecified type scar from previous cesarean delivery: Secondary | ICD-10-CM

## 2019-02-08 DIAGNOSIS — Z3A24 24 weeks gestation of pregnancy: Secondary | ICD-10-CM

## 2019-02-08 DIAGNOSIS — Z98891 History of uterine scar from previous surgery: Secondary | ICD-10-CM

## 2019-02-08 DIAGNOSIS — Z113 Encounter for screening for infections with a predominantly sexual mode of transmission: Secondary | ICD-10-CM

## 2019-02-08 NOTE — Progress Notes (Signed)
Routine Prenatal Care Visit  Subjective  Debra Wilson is a 33 y.o. G3P1011 at 4791w5d being seen today for ongoing prenatal care.  She is currently monitored for the following issues for this high-risk pregnancy and has Supervision of other normal pregnancy, antepartum; History of gestational diabetes; History of cesarean delivery; and Gestational diabetes mellitus (GDM) in second trimester on their problem list.  ----------------------------------------------------------------------------------- Patient reports no complaints.   Contractions: Not present. Vag. Bleeding: None.  Movement: Present. Denies leaking of fluid.  GDM: brings nearly complete BG log.  > 1/2 normal.  Most normal.   ----------------------------------------------------------------------------------- The following portions of the patient's history were reviewed and updated as appropriate: allergies, current medications, past family history, past medical history, past social history, past surgical history and problem list. Problem list updated.   Objective  Blood pressure 118/74, weight 133 lb (60.3 kg), last menstrual period 08/19/2018. Pregravid weight 116 lb (52.6 kg) Total Weight Gain 17 lb (7.711 kg) Urinalysis: Urine Protein    Urine Glucose    Fetal Status: Fetal Heart Rate (bpm): 140 Fundal Height: 25 cm Movement: Present     General:  Alert, oriented and cooperative. Patient is in no acute distress.  Skin: Skin is warm and dry. No rash noted.   Cardiovascular: Normal heart rate noted  Respiratory: Normal respiratory effort, no problems with respiration noted  Abdomen: Soft, gravid, appropriate for gestational age. Pain/Pressure: Absent     Pelvic:  Cervical exam deferred        Extremities: Normal range of motion.  Edema: None  Mental Status: Normal mood and affect. Normal behavior. Normal judgment and thought content.   Assessment   32 y.o. G3P1011 at 6891w5d by  05/26/2019, by Last Menstrual Period  presenting for routine prenatal visit  Plan   Pregnancy#2 Problems (from 08/19/18 to present)    Problem Noted Resolved   Gestational diabetes mellitus (GDM) in second trimester 11/24/2018 by Nadara MustardHarris, Robert P, MD No   Overview Signed 11/24/2018  9:55 AM by Nadara MustardHarris, Robert P, MD    Current Diabetic Medications:  None  [ ]  Aspirin 81 mg daily after 12 weeks; discontinue after 36 weeks (? A2/B GDM)  Required Referrals for A1GDM or A2GDM:(as of 11/24/18) [ x] Diabetes Education and Child psychotherapistTesting Supplies [ x] Nutrition Cousult  For A2/B GDM or higher classes of DM [ ]  Diabetes Education and Testing Supplies [ ]  Nutrition Counsult [ ]  Fetal ECHO after 22-24 weeks  [ ]  Eye exam for retina evaluation  [ ]  Baseline EKG [ ]  US fetal growth every 4 weeks starting at 28 weeks [ ]  Twice weekly NST starting at [redacted] weeks gestation [ ]  Delivery planning contingent on fetal growth, AFI, glycemic control, and other co-morbidities but at least by 39 weeks  Baseline and surveillance labs ([facility], refresh links as needed)  Lab Results  Component Value Date   CREATININE 0.62 09/30/2018   AST 18 09/30/2018   ALT 14 09/30/2018   TSH 0.816 09/30/2018   No results found for: HGBA1C  Antenatal Testing Class of DM U/S NST/AFI DELIVERY  Diabetes   A1 - good control - O24.410    A2 - good control - O24.419      A2  - poor control or poor compliance - O24.419, E11.65   (Macrosomia or polyhydramnios) **E11.65 is extra code for poor control**    A2/B - O24.919  and B-C O24.319  Poor control B-C or D-R-F-T - O24.319  or  Type  I DM - O24.019  20-38  20-38  20-24-28-32-36   20-24-28-32-35-38//fetal echo  20-24-27-30-33-36-38//fetal echo  40  32//2 x wk  32//2 x wk   32//2 x wk  28//BPP wkly then 32//2 x wk  40  39  PRN   39  PRN          History of cesarean delivery 10/18/2018 by Gae Dry, MD No   Supervision of other normal pregnancy, antepartum 10/11/2018 by  Rod Can, CNM No   Overview Addendum 11/24/2018  9:57 AM by Gae Dry, MD    Clinic [facility] Prenatal Labs  Dating WS 8 weeks [place] Blood type: B/Positive/-- (03/16 0957)   Genetic Screen    NIPS: normal Antibody:Negative (03/16 0957)  Anatomic [place]  Rubella: 1.65 (03/16 0957) Varicella: @VZVIGG @  GTT Early: 190; abnormal 3 hr GTT  RPR: Non Reactive (03/16 0957)   Rhogam B POS HBsAg: Negative (03/16 0957)   TDaP vaccine  Flu Shot: declined HIV: Non Reactive (03/16 0957)   Baby Food  Breast                              GBS:   Contraception  Pap: 3/16  CBB     CS/VBAC 2013- arrest of descent   Support Person [place]              Preterm labor symptoms and general obstetric precautions including but not limited to vaginal bleeding, contractions, leaking of fluid and fetal movement were reviewed in detail with the patient. Please refer to After Visit Summary for other counseling recommendations.   Return in about 3 weeks (around 03/01/2019) for labs (no gtt) and routine prenatal.  Prentice Docker, MD, [place], [facility] 02/08/2019 9:59 AM

## 2019-03-01 ENCOUNTER — Encounter: Payer: Self-pay | Admitting: Obstetrics and Gynecology

## 2019-03-01 ENCOUNTER — Other Ambulatory Visit: Payer: Self-pay

## 2019-03-01 ENCOUNTER — Ambulatory Visit (INDEPENDENT_AMBULATORY_CARE_PROVIDER_SITE_OTHER): Payer: 59 | Admitting: Obstetrics and Gynecology

## 2019-03-01 VITALS — BP 122/80 | Wt 135.0 lb

## 2019-03-01 DIAGNOSIS — Z3A27 27 weeks gestation of pregnancy: Secondary | ICD-10-CM

## 2019-03-01 DIAGNOSIS — O24410 Gestational diabetes mellitus in pregnancy, diet controlled: Secondary | ICD-10-CM

## 2019-03-01 DIAGNOSIS — O34219 Maternal care for unspecified type scar from previous cesarean delivery: Secondary | ICD-10-CM

## 2019-03-01 DIAGNOSIS — Z3480 Encounter for supervision of other normal pregnancy, unspecified trimester: Secondary | ICD-10-CM

## 2019-03-01 DIAGNOSIS — Z98891 History of uterine scar from previous surgery: Secondary | ICD-10-CM

## 2019-03-01 NOTE — Progress Notes (Signed)
Routine Prenatal Care Visit  Subjective  Debra Wilson is a 33 y.o. G3P1011 at [redacted]w[redacted]d being seen today for ongoing prenatal care.  She is currently monitored for the following issues for this high-risk pregnancy and has Supervision of other normal pregnancy, antepartum; History of gestational diabetes; History of cesarean delivery; and Gestational diabetes mellitus (GDM) in third trimester on their problem list.  ----------------------------------------------------------------------------------- Patient reports some numbness, tingling in bilateral palms.  Comes an goes. No loss of sensation Contractions: Not present. Vag. Bleeding: None.  Movement: Present. Denies leaking of fluid.  BG values mostly normal.  Borderline too many elevated 2h pp dinner (see chart below).    ----------------------------------------------------------------------------------- The following portions of the patient's history were reviewed and updated as appropriate: allergies, current medications, past family history, past medical history, past social history, past surgical history and problem list. Problem list updated.   Objective  Blood pressure 122/80, weight 135 lb (61.2 kg), last menstrual period 08/19/2018. Pregravid weight 116 lb (52.6 kg) Total Weight Gain 19 lb (8.618 kg) Urinalysis: Urine Protein    Urine Glucose    Fetal Status: Fetal Heart Rate (bpm): 120 Fundal Height: 28 cm Movement: Present     General:  Alert, oriented and cooperative. Patient is in no acute distress.  Skin: Skin is warm and dry. No rash noted.   Cardiovascular: Normal heart rate noted  Respiratory: Normal respiratory effort, no problems with respiration noted  Abdomen: Soft, gravid, appropriate for gestational age. Pain/Pressure: Absent     Pelvic:  Cervical exam deferred        Extremities: Normal range of motion.  Edema: None  Mental Status: Normal mood and affect. Normal behavior. Normal judgment and thought content.    Assessment   33 y.o. G3P1011 at [redacted]w[redacted]d by  05/26/2019, by Last Menstrual Period presenting for routine prenatal visit  Plan   Pregnancy#2 Problems (from 08/19/18 to present)    Problem Noted Resolved   Gestational diabetes mellitus (GDM) in third trimester 11/24/2018 by Gae Dry, MD No   Overview Signed 11/24/2018  9:55 AM by Gae Dry, MD    Current Diabetic Medications:  None  [ ]  Aspirin 81 mg daily after 12 weeks; discontinue after 36 weeks (? A2/B GDM)  Required Referrals for A1GDM or A2GDM:(as of 11/24/18) [ x] Diabetes Education and Equities trader [ x] Nutrition Cousult  For A2/B GDM or higher classes of DM [ ]  Diabetes Education and Testing Supplies [ ]  Nutrition Counsult [ ]  Fetal ECHO after 22-24 weeks  [ ]  Eye exam for retina evaluation  [ ]  Baseline EKG [ ]  US fetal growth every 4 weeks starting at 28 weeks [ ]  Twice weekly NST starting at [redacted] weeks gestation [ ]  Delivery planning contingent on fetal growth, AFI, glycemic control, and other co-morbidities but at least by 39 weeks  Baseline and surveillance labs (pulled in from [facility], refresh links as needed)  Lab Results  Component Value Date   CREATININE 0.62 09/30/2018   AST 18 09/30/2018   ALT 14 09/30/2018   TSH 0.816 09/30/2018   No results found for: HGBA1C  Antenatal Testing Class of DM U/S NST/AFI DELIVERY  Diabetes   A1 - good control - O24.410    A2 - good control - O24.419      A2  - poor control or poor compliance - O24.419, E11.65   (Macrosomia or polyhydramnios) **E11.65 is extra code for poor control**    A2/B - O24.919  and B-C  O24.319  Poor control B-C or D-R-F-T - O24.319  or  Type I DM - O24.019  20-38  20-38  20-24-28-32-36   20-24-28-32-35-38//fetal echo  20-24-27-30-33-36-38//fetal echo  40  32//2 x wk  32//2 x wk   32//2 x wk  28//BPP wkly then 32//2 x wk  40  39  PRN   39  PRN          History of cesarean delivery 10/18/2018 by  Nadara MustardHarris, Robert P, MD No   Supervision of other normal pregnancy, antepartum 10/11/2018 by Tresea MallGledhill, Jane, CNM No   Overview Addendum 11/24/2018  9:57 AM by Nadara MustardHarris, Robert P, MD    [facility]inic [facility] Prenatal Labs  Dating WS 8 weeks US Blood type: B/Positive/-- (03/16 0957)   Genetic Screen    NIPS: normal Antibody:Negative (03/16 0957)  Anatomic US  Rubella: 1.65 (03/16 0957) Varicella: @VZVIGG @  GTT Early: 190; abnormal 3 hr GTT  RPR: Non Reactive (03/16 0957)   Rhogam B POS HBsAg: Negative (03/16 0957)   TDaP vaccine  Flu Shot: declined HIV: Non Reactive (03/16 0957)   Baby Food  Breast                              GBS:   Contraception  Pap: 3/16  CBB     CS/VBAC 2013- arrest of descent   Support Person [facility]              Preterm labor symptoms and general obstetric precautions including but not limited to vaginal bleeding, contractions, leaking of fluid and fetal movement were reviewed in detail with the patient. Please refer to After Visit Summary for other counseling recommendations.   - Discussed TOLAC.  Consent form reviewed. She took a copy with her to review with her husband.  - continue to monitor BG values. Discussed that she is borderline for needing something for dinner.   Return in about 2 weeks (around 03/15/2019) for Routine Prenatal Appointment/in-person.  Thomasene MohairStephen Shawny Borkowski, MD, Merlinda FrederickFACOG [facility], [facility] 03/01/2019 10:35 AM

## 2019-03-16 ENCOUNTER — Ambulatory Visit (INDEPENDENT_AMBULATORY_CARE_PROVIDER_SITE_OTHER): Payer: 59 | Admitting: Obstetrics and Gynecology

## 2019-03-16 ENCOUNTER — Encounter: Payer: Self-pay | Admitting: Obstetrics and Gynecology

## 2019-03-16 ENCOUNTER — Other Ambulatory Visit: Payer: Self-pay

## 2019-03-16 VITALS — BP 108/60 | Wt 137.0 lb

## 2019-03-16 DIAGNOSIS — O24410 Gestational diabetes mellitus in pregnancy, diet controlled: Secondary | ICD-10-CM

## 2019-03-16 DIAGNOSIS — Z3480 Encounter for supervision of other normal pregnancy, unspecified trimester: Secondary | ICD-10-CM

## 2019-03-16 DIAGNOSIS — O09293 Supervision of pregnancy with other poor reproductive or obstetric history, third trimester: Secondary | ICD-10-CM

## 2019-03-16 DIAGNOSIS — O34219 Maternal care for unspecified type scar from previous cesarean delivery: Secondary | ICD-10-CM

## 2019-03-16 DIAGNOSIS — Z98891 History of uterine scar from previous surgery: Secondary | ICD-10-CM

## 2019-03-16 DIAGNOSIS — Z3A29 29 weeks gestation of pregnancy: Secondary | ICD-10-CM

## 2019-03-16 NOTE — Progress Notes (Signed)
Routine Prenatal Care Visit  Subjective  Debra Wilson is a 33 y.o. G3P1011 at 562w6d being seen today for ongoing prenatal care.  She is currently monitored for the following issues for this high-risk pregnancy and has Supervision of other normal pregnancy, antepartum; History of gestational diabetes; History of cesarean delivery; and Gestational diabetes mellitus (GDM) in third trimester on their problem list.  ----------------------------------------------------------------------------------- Patient reports no complaints.   Contractions: Not present. Vag. Bleeding: None.  Movement: Present. Denies leaking of fluid.  ----------------------------------------------------------------------------------- The following portions of the patient's history were reviewed and updated as appropriate: allergies, current medications, past family history, past medical history, past social history, past surgical history and problem list. Problem list updated.   Objective  Blood pressure 108/60, weight 137 lb (62.1 kg), last menstrual period 08/19/2018. Pregravid weight 116 lb (52.6 kg) Total Weight Gain 21 lb (9.526 kg) Urinalysis:      Fetal Status: Fetal Heart Rate (bpm): 135 Fundal Height: 30 cm Movement: Present     General:  Alert, oriented and cooperative. Patient is in no acute distress.  Skin: Skin is warm and dry. No rash noted.   Cardiovascular: Normal heart rate noted  Respiratory: Normal respiratory effort, no problems with respiration noted  Abdomen: Soft, gravid, appropriate for gestational age. Pain/Pressure: Absent     Pelvic:  Cervical exam deferred        Extremities: Normal range of motion.  Edema: None  Mental Status: Normal mood and affect. Normal behavior. Normal judgment and thought content.     Assessment   32 y.o. G3P1011 at 112w6d by  05/26/2019, by Last Menstrual Period presenting for routine prenatal visit  Plan   Pregnancy#2 Problems (from 08/19/18 to present)     Problem Noted Resolved   Gestational diabetes mellitus (GDM) in third trimester 11/24/2018 by Nadara MustardHarris, Robert P, MD No   Overview Signed 11/24/2018  9:55 AM by Nadara MustardHarris, Robert P, MD    Current Diabetic Medications:  None  [ ]  Aspirin 81 mg daily after 12 weeks; discontinue after 36 weeks (? A2/B GDM)  Required Referrals for A1GDM or A2GDM:(as of 11/24/18) [ x] Diabetes Education and Child psychotherapistTesting Supplies [ x] Nutrition Cousult  For A2/B GDM or higher classes of DM [ ]  Diabetes Education and Testing Supplies [ ]  Nutrition Counsult [ ]  Fetal ECHO after 22-24 weeks  [ ]  Eye exam for retina evaluation  [ ]  Baseline EKG [ ]  US fetal growth every 4 weeks starting at 28 weeks [ ]  Twice weekly NST starting at [redacted] weeks gestation [ ]  Delivery planning contingent on fetal growth, AFI, glycemic control, and other co-morbidities but at least by 39 weeks  Baseline and surveillance [facility], refresh links as needed)  Lab Results  Component Value Date   CREATININE 0.62 09/30/2018   AST 18 09/30/2018   ALT 14 09/30/2018   TSH 0.816 09/30/2018   No results found for: HGBA1C  Antenatal Testing Class of DM U/S NST/AFI DELIVERY  Diabetes   A1 - good control - O24.410    A2 - good control - O24.419      A2  - poor control or poor compliance - O24.419, E11.65   (Macrosomia or polyhydramnios) **E11.65 is extra code for poor control**    A2/B - O24.919  and B-C O24.319  Poor control B-C or D-R-F-T - O24.319  or  Type I DM - O24.019  20-38  20-38  20-24-28-32-36   20-24-28-32-35-38//fetal echo  20-24-27-30-33-36-38//fetal  echo  40  32//2 x wk  32//2 x wk   32//2 x wk  28//BPP wkly then 32//2 x wk  40  39  PRN   39  PRN          History of cesarean delivery 10/18/2018 by Gae Dry, MD No   Supervision of other normal pregnancy, antepartum 10/11/2018 by Rod Can, CNM No   Overview Addendum 11/24/2018  9:57 AM by Gae Dry, MD    Clinic  [facility]  Dating WS 8 weeks [place] Blood type: B/Positive/-- (03/16 0957)   Genetic Screen    NIPS: normal Antibody:Negative (03/16 0957)  Anatomic [place] complete Rubella: 1.65 (03/16 0957) Varicella: Immune  GTT Early: 190; abnormal 3 hr GTT  RPR: Non Reactive (03/16 0957)   Rhogam B POS HBsAg: Negative (03/16 0957)   TDaP vaccine  Flu Shot: declined HIV: Non Reactive (03/16 0957)   Baby Food  Breast                              GBS:   Contraception  Pap: 3/16  CBB     CS/VBAC 2013- arrest of descent   Support Person [place]                    Gestational age appropriate obstetric precautions including but not limited to vaginal bleeding, contractions, leaking of fluid and fetal movement were reviewed in detail with the patient.    Log reviewed 14/55 elevated- continue diet modifications NST and growth [place] next visit TDAP today Discussed repeat cesarean vs TOLAC, patient will decide at next visit.  Reports she pushed for 4 hours and then had a cesarean with her last pregnancy. 7lbs fetus, strongly advised repeat cesarean section.   Return in about 2 weeks (around 03/30/2019) for ROB with MD/ NST/ [place].  Homero Fellers MD [facility], [facility] 03/16/2019, 8:47 AM

## 2019-03-16 NOTE — Progress Notes (Signed)
ROB C/o sometimes out of breath and hand and feet feeling heavy Tdap/BT consent today

## 2019-03-30 ENCOUNTER — Other Ambulatory Visit: Payer: Self-pay

## 2019-03-30 ENCOUNTER — Encounter: Payer: Self-pay | Admitting: Obstetrics & Gynecology

## 2019-03-30 ENCOUNTER — Ambulatory Visit (INDEPENDENT_AMBULATORY_CARE_PROVIDER_SITE_OTHER): Payer: 59 | Admitting: Obstetrics & Gynecology

## 2019-03-30 ENCOUNTER — Ambulatory Visit (INDEPENDENT_AMBULATORY_CARE_PROVIDER_SITE_OTHER): Payer: 59

## 2019-03-30 VITALS — BP 102/60 | Wt 143.0 lb

## 2019-03-30 DIAGNOSIS — Z3480 Encounter for supervision of other normal pregnancy, unspecified trimester: Secondary | ICD-10-CM

## 2019-03-30 DIAGNOSIS — O24410 Gestational diabetes mellitus in pregnancy, diet controlled: Secondary | ICD-10-CM | POA: Diagnosis not present

## 2019-03-30 DIAGNOSIS — O34219 Maternal care for unspecified type scar from previous cesarean delivery: Secondary | ICD-10-CM | POA: Diagnosis not present

## 2019-03-30 DIAGNOSIS — Z362 Encounter for other antenatal screening follow-up: Secondary | ICD-10-CM | POA: Diagnosis not present

## 2019-03-30 DIAGNOSIS — Z3A31 31 weeks gestation of pregnancy: Secondary | ICD-10-CM | POA: Diagnosis not present

## 2019-03-30 DIAGNOSIS — Z98891 History of uterine scar from previous surgery: Secondary | ICD-10-CM

## 2019-03-30 LAB — POCT URINALYSIS DIPSTICK OB
Glucose, UA: NEGATIVE
POC,PROTEIN,UA: NEGATIVE

## 2019-03-30 NOTE — Progress Notes (Signed)
Subjective  Fetal Movement? yes Contractions? no Leaking Fluid? no Vaginal Bleeding? no BS mostly <120 (2 hours PP BS) Objective  BP 102/60   Wt 143 lb (64.9 kg)   LMP 08/19/2018 (Approximate)   BMI 28.88 kg/m  General: NAD Pumonary: no increased work of breathing Abdomen: gravid, non-tender Extremities: no edema Psychiatric: mood appropriate, affect full  Assessment  33 y.o. G3P1011 at [redacted]w[redacted]d by  05/26/2019, by Last Menstrual Period presenting for routine prenatal visit  Plan   Problem List Items Addressed This Visit      Endocrine   Gestational diabetes mellitus (GDM) in third trimester   Relevant Orders   US OB Limited     Other   Supervision of other normal pregnancy, antepartum   History of cesarean delivery - Primary    Other Visit Diagnoses    [redacted] weeks gestation of pregnancy       Relevant Orders   POC Urinalysis Dipstick OB (Completed)      Pregnancy#2 Problems (from 08/19/18 to present)    Problem Noted Resolved   Gestational diabetes mellitus (GDM) in third trimester 11/24/2018 by Gae Dry, MD No   Overview Signed 11/24/2018  9:55 AM by Gae Dry, MD    Current Diabetic Medications:  None  [ ]  Aspirin 81 mg daily after 12 weeks; discontinue after 36 weeks (? A2/B GDM)  Required Referrals for A1GDM or A2GDM:(as of 11/24/18) [ x] Diabetes Education and Testing Supplies [ x] Nutrition Cousult  For A2/B GDM or higher classes of DM [ ]  Diabetes Education and Testing Supplies [ ]  Nutrition Counsult [ ]  Fetal ECHO after 22-24 weeks  [ ]  Eye exam for retina evaluation  [ ]  Baseline EKG [ ]  US fetal growth every 4 weeks starting at 28 weeks [ ]  Twice weekly NST starting at [redacted] weeks gestation [ ]  Delivery planning contingent on fetal growth, AFI, glycemic control, and other co-morbidities but at least by 39 weeks  Baseline and surveillance labs (pulled in from [facility], refresh links as needed)  Lab Results  Component Value Date   CREATININE  0.62 09/30/2018   AST 18 09/30/2018   ALT 14 09/30/2018   TSH 0.816 09/30/2018   No results found for: HGBA1C  Antenatal Testing Class of DM U/S NST/AFI DELIVERY  Diabetes   A1 - good control - O24.410    A2 - good control - O24.419      A2  - poor control or poor compliance - O24.419, E11.65   (Macrosomia or polyhydramnios) **E11.65 is extra code for poor control**    A2/B - O24.919  and B-C O24.319  Poor control B-C or D-R-F-T - O24.319  or  Type I DM - O24.019  20-38  20-38  20-24-28-32-36   20-24-28-32-35-38//fetal echo  20-24-27-30-33-36-38//fetal echo  40  32//2 x wk  32//2 x wk   32//2 x wk  28//BPP wkly then 32//2 x wk  40  39  PRN   39  PRN          History of cesarean delivery 10/18/2018 by Gae Dry, MD No   Supervision of other normal pregnancy, antepartum 10/11/2018 by Rod Can, CNM No   Overview Addendum 03/16/2019 11:46 AM by Homero Fellers, MD    Clinic [facility]  Dating WS 8 weeks [place] Blood type: B/Positive/-- (03/16 0957)   Genetic Screen    NIPS: normal Antibody:Negative (03/16 0957)  Anatomic [place] complete Rubella: 1.65 (03/16 0957) Varicella: Immune  GTT Early: 190; abnormal 3 hr GTT  RPR: Non Reactive (03/16 0957)   Rhogam B POS HBsAg: Negative (03/16 0957)   TDaP vaccine  8/19/2020Flu Shot: declined HIV: Non Reactive (03/16 0957)   Baby Food  Breast                              GBS:   Contraception  Pap: 3/16  CBB     CS/VBAC 2013- arrest of descent   Support [facility]            Review of ULTRASOUND.    [facility]    Plan of management to be discussed with patient. Weekly AFI, NST BS, diet controlled CS to be schedule for Oct 29 or 30 (sch nv)   Desires VBAC if labors prior to this date PNV  Annamarie MajorPaul Elgie Landino, MD, Merlinda FrederickFACOG [facility], [facility] 03/30/2019  4:55 PM

## 2019-04-08 ENCOUNTER — Ambulatory Visit: Payer: 59

## 2019-04-08 ENCOUNTER — Encounter: Payer: Self-pay | Admitting: Maternal Newborn

## 2019-04-08 ENCOUNTER — Other Ambulatory Visit: Payer: Self-pay

## 2019-04-08 ENCOUNTER — Encounter: Payer: 59 | Admitting: Maternal Newborn

## 2019-04-08 ENCOUNTER — Ambulatory Visit (INDEPENDENT_AMBULATORY_CARE_PROVIDER_SITE_OTHER): Payer: 59 | Admitting: Maternal Newborn

## 2019-04-08 ENCOUNTER — Ambulatory Visit (INDEPENDENT_AMBULATORY_CARE_PROVIDER_SITE_OTHER): Payer: 59

## 2019-04-08 VITALS — BP 110/70 | Wt 141.0 lb

## 2019-04-08 DIAGNOSIS — Z3A33 33 weeks gestation of pregnancy: Secondary | ICD-10-CM

## 2019-04-08 DIAGNOSIS — O24410 Gestational diabetes mellitus in pregnancy, diet controlled: Secondary | ICD-10-CM | POA: Diagnosis not present

## 2019-04-08 DIAGNOSIS — O09293 Supervision of pregnancy with other poor reproductive or obstetric history, third trimester: Secondary | ICD-10-CM | POA: Diagnosis not present

## 2019-04-08 DIAGNOSIS — O34219 Maternal care for unspecified type scar from previous cesarean delivery: Secondary | ICD-10-CM

## 2019-04-08 DIAGNOSIS — Z369 Encounter for antenatal screening, unspecified: Secondary | ICD-10-CM

## 2019-04-08 DIAGNOSIS — O0990 Supervision of high risk pregnancy, unspecified, unspecified trimester: Secondary | ICD-10-CM

## 2019-04-08 LAB — POCT URINALYSIS DIPSTICK OB
Glucose, UA: NEGATIVE
POC,PROTEIN,UA: NEGATIVE

## 2019-04-08 NOTE — Progress Notes (Signed)
ROB/AFI/NST- no concerns 

## 2019-04-08 NOTE — Progress Notes (Signed)
Routine Prenatal Care Visit  Subjective  Debra Wilson is a 33 y.o. G3P1011 at [redacted]w[redacted]d being seen today for ongoing prenatal care.  She is currently monitored for the following issues for this high-risk pregnancy and has Supervision of high risk pregnancy, antepartum; History of gestational diabetes; History of cesarean delivery; and Gestational diabetes mellitus (GDM) in third trimester on their problem list.  ----------------------------------------------------------------------------------- Patient reports no complaints.   Contractions: Not present. Vag. Bleeding: None.  Movement: Present. No leaking of fluid.  ----------------------------------------------------------------------------------- The following portions of the patient's history were reviewed and updated as appropriate: allergies, current medications, past family history, past medical history, past social history, past surgical history and problem list. Problem list updated.   Objective  Blood pressure 110/70, weight 141 lb (64 kg), last menstrual period 08/19/2018. Pregravid weight 116 lb (52.6 kg) Total Weight Gain 25 lb (11.3 kg) Urinalysis: Urine dipstick shows negative for glucose, protein.  Fetal Status: Fetal Heart Rate (bpm): 135   Movement: Present     General:  Alert, oriented and cooperative. Patient is in no acute distress.  Skin: Skin is warm and dry. No rash noted.   Cardiovascular: Normal heart rate noted  Respiratory: Normal respiratory effort, no problems with respiration noted  Abdomen: Soft, gravid, appropriate for gestational age. Pain/Pressure: Absent     Pelvic:  Cervical exam deferred        Extremities: Normal range of motion.  Edema: None  Mental Status: Normal mood and affect. Normal behavior. Normal judgment and thought content.   NST Baseline: 135 bpm Variability: moderate Accelerations: present Decelerations: present, occasional mild variable to 115-120 bpm with rapid return to baseline  Tocometry: not done The patient was monitored for 20 minutes, fetal heart rate tracing was deemed reactive.  Assessment   33 y.o. [id number] at [redacted]w[redacted]d, EDD 05/26/2019 by Last Menstrual Period presenting for a routine prenatal visit.  Plan   Pregnancy#2 Problems (from 08/19/18 to present)    Problem Noted Resolved   Gestational diabetes mellitus (GDM) in third trimester 11/24/2018 by Gae Dry, MD No   Overview Signed 11/24/2018  9:55 AM by Gae Dry, MD    Current Diabetic Medications:  None  [ ]  Aspirin 81 mg daily after 12 weeks; discontinue after 36 weeks (? A2/B GDM)  Required Referrals for A1GDM or A2GDM:(as of 11/24/18) [ x] Diabetes Education and Equities trader [ x] Nutrition Cousult  For A2/B GDM or higher classes of DM [ ]  Diabetes Education and Testing Supplies [ ]  Nutrition Counsult [ ]  Fetal ECHO after 22-24 weeks  [ ]  Eye exam for retina evaluation  [ ]  Baseline EKG [ ]  US fetal growth every 4 weeks starting at 28 weeks [ ]  Twice weekly NST starting at [redacted] weeks gestation [ ]  Delivery planning contingent on fetal growth, AFI, glycemic control, and other co-morbidities but at least by 39 weeks  Baseline and surveillance labs (pulled in from [facility], refresh links as needed)  Lab Results  Component Value Date   CREATININE 0.62 09/30/2018   AST 18 09/30/2018   ALT 14 09/30/2018   TSH 0.816 09/30/2018   No results found for: HGBA1C  Antenatal Testing Class of DM U/S NST/AFI DELIVERY  Diabetes   A1 - good control - O24.410    A2 - good control - O24.419      A2  - poor control or poor compliance - O24.419, E11.65   (Macrosomia or polyhydramnios) **E11.65 is extra code for poor control**  A2/B - O24.919  and B-C O24.319  Poor control B-C or D-R-F-T - O24.319  or  Type I DM - O24.019  20-38  20-38  20-24-28-32-36   20-24-28-32-35-38//fetal echo  20-24-27-30-33-36-38//fetal echo  40  32//2 x wk  32//2 x wk   32//2 x wk  28//BPP wkly  then 32//2 x wk  40  39  PRN   39  PRN          History of cesarean delivery 10/18/2018 by Debra MustardHarris, Robert P, MD No   Supervision of other normal pregnancy, antepartum 10/11/2018 by Tresea MallGledhill, Debra, CNM No   Overview Addendum 03/16/2019 11:46 AM by Debra Wilson, Christanna R, MD    Clinic [facility]  Dating WS 8 weeks US Blood type: B/Positive/-- (03/16 0957)   Genetic Screen    NIPS: normal Antibody:Negative (03/16 0957)  Anatomic US complete Rubella: 1.65 (03/16 0957) Varicella: Immune  GTT Early: 190; abnormal 3 hr GTT  RPR: Non Reactive (03/16 0957)   Rhogam B POS HBsAg: Negative (03/16 0957)   TDaP vaccine  8/19/2020Flu Shot: declined HIV: Non Reactive (03/16 0957)   Baby Food  Breast                              GBS:   Contraception  Pap: 3/16  CBB     CS/VBAC 2013- arrest of descent   Support Person            Reactive NST; AFI 10.7 cm, cephalic presentation  Discussed scheduling of Cesarean, plan is for 40 weeks as she discussed with Dr. Tiburcio PeaHarris last visit (10/29). Briefly talked about trying for VBAC and reasons why the surgery would need to be moved up.  Blood glucose log today shows fasting values all normal except 2 (range 87-102, abnormal values 98, 102); postprandial values also within normal limits except for 3 (range 98-161, abnormal values 124, 137, 161). Had some missing readings when she ran out of test strips. She recognizes foods that she has eaten that corresponded with abnormal values and is avoiding these choices.  Please refer to After Visit Summary for other counseling recommendations.   Return in about 1 week (around 04/15/2019) for ROB with NST/AFI.  Debra Wilson , CNM 04/08/2019  4:57 PM

## 2019-04-08 NOTE — Patient Instructions (Signed)
Third Trimester of Pregnancy The third trimester is from week 28 through week 40 (months 7 through 9). The third trimester is a time when the unborn baby (fetus) is growing rapidly. At the end of the ninth month, the fetus is about 20 inches in length and weighs 6-10 pounds. Body changes during your third trimester Your body will continue to go through many changes during pregnancy. The changes vary from woman to woman. During the third trimester:  Your weight will continue to increase. You can expect to gain 25-35 pounds (11-16 kg) by the end of the pregnancy.  You may begin to get stretch marks on your hips, abdomen, and breasts.  You may urinate more often because the fetus is moving lower into your pelvis and pressing on your bladder.  You may develop or continue to have heartburn. This is caused by increased hormones that slow down muscles in the digestive tract.  You may develop or continue to have constipation because increased hormones slow digestion and cause the muscles that push waste through your intestines to relax.  You may develop hemorrhoids. These are swollen veins (varicose veins) in the rectum that can itch or be painful.  You may develop swollen, bulging veins (varicose veins) in your legs.  You may have increased body aches in the pelvis, back, or thighs. This is due to weight gain and increased hormones that are relaxing your joints.  You may have changes in your hair. These can include thickening of your hair, rapid growth, and changes in texture. Some women also have hair loss during or after pregnancy, or hair that feels dry or thin. Your hair will most likely return to normal after your baby is born.  Your breasts will continue to grow and they will continue to become tender. A yellow fluid (colostrum) may leak from your breasts. This is the first milk you are producing for your baby.  Your belly button may stick out.  You may notice more swelling in your hands,  face, or ankles.  You may have increased tingling or numbness in your hands, arms, and legs. The skin on your belly may also feel numb.  You may feel short of breath because of your expanding uterus.  You may have more problems sleeping. This can be caused by the size of your belly, increased need to urinate, and an increase in your body's metabolism.  You may notice the fetus "dropping," or moving lower in your abdomen (lightening).  You may have increased vaginal discharge.  You may notice your joints feel loose and you may have pain around your pelvic bone. What to expect at prenatal visits You will have prenatal exams every 2 weeks until week 36. Then you will have weekly prenatal exams. During a routine prenatal visit:  You will be weighed to make sure you and the baby are growing normally.  Your blood pressure will be taken.  Your abdomen will be measured to track your baby's growth.  The fetal heartbeat will be listened to.  Any test results from the previous visit will be discussed.  You may have a cervical check near your due date to see if your cervix has softened or thinned (effaced).  You will be tested for Group B streptococcus. This happens between 35 and 37 weeks. Your health care provider may ask you:  What your birth plan is.  How you are feeling.  If you are feeling the baby move.  If you have had any abnormal   symptoms, such as leaking fluid, bleeding, severe headaches, or abdominal cramping.  If you are using any tobacco products, including cigarettes, chewing tobacco, and electronic cigarettes.  If you have any questions. Other tests or screenings that may be performed during your third trimester include:  Blood tests that check for low iron levels (anemia).  Fetal testing to check the health, activity level, and growth of the fetus. Testing is done if you have certain medical conditions or if there are problems during the pregnancy.  Nonstress test  (NST). This test checks the health of your baby to make sure there are no signs of problems, such as the baby not getting enough oxygen. During this test, a belt is placed around your belly. The baby is made to move, and its heart rate is monitored during movement. What is false labor? False labor is a condition in which you feel small, irregular tightenings of the muscles in the womb (contractions) that usually go away with rest, changing position, or drinking water. These are called Braxton Hicks contractions. Contractions may last for hours, days, or even weeks before true labor sets in. If contractions come at regular intervals, become more frequent, increase in intensity, or become painful, you should see your health care provider. What are the signs of labor?  Abdominal cramps.  Regular contractions that start at 10 minutes apart and become stronger and more frequent with time.  Contractions that start on the top of the uterus and spread down to the lower abdomen and back.  Increased pelvic pressure and dull back pain.  A watery or bloody mucus discharge that comes from the vagina.  Leaking of amniotic fluid. This is also known as your "water breaking." It could be a slow trickle or a gush. Let your health care provider know if it has a color or strange odor. If you have any of these signs, call your health care provider right away, even if it is before your due date. Follow these instructions at home: Medicines  Follow your health care provider's instructions regarding medicine use. Specific medicines may be either safe or unsafe to take during pregnancy.  Take a prenatal vitamin that contains at least 600 micrograms (mcg) of folic acid.  If you develop constipation, try taking a stool softener if your health care provider approves. Eating and drinking   Eat a balanced diet that includes fresh fruits and vegetables, whole grains, good sources of protein such as meat, eggs, or tofu,  and low-fat dairy. Your health care provider will help you determine the amount of weight gain that is right for you.  Avoid raw meat and uncooked cheese. These carry germs that can cause birth defects in the baby.  If you have low calcium intake from food, talk to your health care provider about whether you should take a daily calcium supplement.  Eat four or five small meals rather than three large meals a day.  Limit foods that are high in fat and processed sugars, such as fried and sweet foods.  To prevent constipation: ? Drink enough fluid to keep your urine clear or pale yellow. ? Eat foods that are high in fiber, such as fresh fruits and vegetables, whole grains, and beans. Activity  Exercise only as directed by your health care provider. Most women can continue their usual exercise routine during pregnancy. Try to exercise for 30 minutes at least 5 days a week. Stop exercising if you experience uterine contractions.  Avoid heavy lifting.  Do   not exercise in extreme heat or humidity, or at high altitudes.  Wear low-heel, comfortable shoes.  Practice good posture.  You may continue to have sex unless your health care provider tells you otherwise. Relieving pain and discomfort  Take frequent breaks and rest with your legs elevated if you have leg cramps or low back pain.  Take warm sitz baths to soothe any pain or discomfort caused by hemorrhoids. Use hemorrhoid cream if your health care provider approves.  Wear a good support bra to prevent discomfort from breast tenderness.  If you develop varicose veins: ? Wear support pantyhose or compression stockings as told by your healthcare provider. ? Elevate your feet for 15 minutes, 3-4 times a day. Prenatal care  Write down your questions. Take them to your prenatal visits.  Keep all your prenatal visits as told by your health care provider. This is important. Safety  Wear your seat belt at all times when driving.  Make  a list of emergency phone numbers, including numbers for family, friends, the hospital, and police and fire departments. General instructions  Avoid cat litter boxes and soil used by cats. These carry germs that can cause birth defects in the baby. If you have a cat, ask someone to clean the litter box for you.  Do not travel far distances unless it is absolutely necessary and only with the approval of your health care provider.  Do not use hot tubs, steam rooms, or saunas.  Do not drink alcohol.  Do not use any products that contain nicotine or tobacco, such as cigarettes and e-cigarettes. If you need help quitting, ask your health care provider.  Do not use any medicinal herbs or unprescribed drugs. These chemicals affect the formation and growth of the baby.  Do not douche or use tampons or scented sanitary pads.  Do not cross your legs for long periods of time.  To prepare for the arrival of your baby: ? Take prenatal classes to understand, practice, and ask questions about labor and delivery. ? Make a trial run to the hospital. ? Visit the hospital and tour the maternity area. ? Arrange for maternity or paternity leave through employers. ? Arrange for family and friends to take care of pets while you are in the hospital. ? Purchase a rear-facing car seat and make sure you know how to install it in your car. ? Pack your hospital bag. ? Prepare the baby's nursery. Make sure to remove all pillows and stuffed animals from the baby's crib to prevent suffocation.  Visit your dentist if you have not gone during your pregnancy. Use a soft toothbrush to brush your teeth and be gentle when you floss. Contact a health care provider if:  You are unsure if you are in labor or if your water has broken.  You become dizzy.  You have mild pelvic cramps, pelvic pressure, or nagging pain in your abdominal area.  You have lower back pain.  You have persistent nausea, vomiting, or diarrhea.   You have an unusual or bad smelling vaginal discharge.  You have pain when you urinate. Get help right away if:  Your water breaks before 37 weeks.  You have regular contractions less than 5 minutes apart before 37 weeks.  You have a fever.  You are leaking fluid from your vagina.  You have spotting or bleeding from your vagina.  You have severe abdominal pain or cramping.  You have rapid weight loss or weight gain.  You have   shortness of breath with chest pain.  You notice sudden or extreme swelling of your face, hands, ankles, feet, or legs.  Your baby makes fewer than 10 movements in 2 hours.  You have severe headaches that do not go away when you take medicine.  You have vision changes. Summary  The third trimester is from week 28 through week 40, months 7 through 9. The third trimester is a time when the unborn baby (fetus) is growing rapidly.  During the third trimester, your discomfort may increase as you and your baby continue to gain weight. You may have abdominal, leg, and back pain, sleeping problems, and an increased need to urinate.  During the third trimester your breasts will keep growing and they will continue to become tender. A yellow fluid (colostrum) may leak from your breasts. This is the first milk you are producing for your baby.  False labor is a condition in which you feel small, irregular tightenings of the muscles in the womb (contractions) that eventually go away. These are called Braxton Hicks contractions. Contractions may last for hours, days, or even weeks before true labor sets in.  Signs of labor can include: abdominal cramps; regular contractions that start at 10 minutes apart and become stronger and more frequent with time; watery or bloody mucus discharge that comes from the vagina; increased pelvic pressure and dull back pain; and leaking of amniotic fluid. This information is not intended to replace advice given to you by your health  care provider. Make sure you discuss any questions you have with your health care provider. Document Released: 07/08/2001 Document Revised: 11/04/2018 Document Reviewed: 08/19/2016 Elsevier Patient Education  2020 [employer]  

## 2019-04-11 ENCOUNTER — Telehealth: Payer: Self-pay | Admitting: Obstetrics and Gynecology

## 2019-04-11 NOTE — Telephone Encounter (Signed)
Patient is aware of H&P at [facility] on 05/23/19 @ 8:50am w/ Dr. Glennon Mac, Pre-admit Testing to be scheduled, Covid testing on 05/24/19 @ 9-10am, and OR on 05/26/19. Patient is aware she will be asked to quarantine after Covid testing.

## 2019-04-11 NOTE — Telephone Encounter (Signed)
-----   Message from Rexene Agent, CNM sent at 04/08/2019  3:46 PM EDT ----- Regarding: Schedule Cesarean 10/29 Surgery Booking Request Patient Full Name: Debra Wilson MRN: [id number]  DOB: 1985-10-16  Surgeon: Prentice Docker, MD Requested Surgery Date and Time: 05/26/2019 first available time Primary Diagnosis AND Code: Repeat Cesarean Secondary Diagnosis and Code:  Surgical Procedure: Cesarean Section L&D Notification: Yes Admission Status: surgery admit Length of Surgery: 1 hour Special Case Needs: On-Q pump H&P:  needs (date)  Phone Interview???:  Interpreter: Language:  Medical Clearance:  Special Scheduling Instructions:

## 2019-04-14 ENCOUNTER — Other Ambulatory Visit: Payer: Self-pay

## 2019-04-14 ENCOUNTER — Ambulatory Visit (INDEPENDENT_AMBULATORY_CARE_PROVIDER_SITE_OTHER): Payer: 59

## 2019-04-14 ENCOUNTER — Observation Stay
Admission: EM | Admit: 2019-04-14 | Discharge: 2019-04-14 | Disposition: A | Discharge status: Home / Self Care | Payer: 59 | Attending: Obstetrics and Gynecology | Admitting: Obstetrics and Gynecology

## 2019-04-14 ENCOUNTER — Ambulatory Visit (INDEPENDENT_AMBULATORY_CARE_PROVIDER_SITE_OTHER): Payer: 59 | Admitting: Certified Nurse Midwife

## 2019-04-14 VITALS — BP 110/70 | Wt 142.4 lb

## 2019-04-14 DIAGNOSIS — Z369 Encounter for antenatal screening, unspecified: Secondary | ICD-10-CM

## 2019-04-14 DIAGNOSIS — Z3A34 34 weeks gestation of pregnancy: Secondary | ICD-10-CM | POA: Insufficient documentation

## 2019-04-14 DIAGNOSIS — O0993 Supervision of high risk pregnancy, unspecified, third trimester: Secondary | ICD-10-CM | POA: Diagnosis not present

## 2019-04-14 DIAGNOSIS — O368390 Maternal care for abnormalities of the fetal heart rate or rhythm, unspecified trimester, not applicable or unspecified: Secondary | ICD-10-CM | POA: Diagnosis not present

## 2019-04-14 DIAGNOSIS — Z79899 Other long term (current) drug therapy: Secondary | ICD-10-CM | POA: Insufficient documentation

## 2019-04-14 DIAGNOSIS — Z98891 History of uterine scar from previous surgery: Secondary | ICD-10-CM

## 2019-04-14 DIAGNOSIS — O24410 Gestational diabetes mellitus in pregnancy, diet controlled: Secondary | ICD-10-CM | POA: Diagnosis not present

## 2019-04-14 DIAGNOSIS — O0990 Supervision of high risk pregnancy, unspecified, unspecified trimester: Secondary | ICD-10-CM

## 2019-04-14 DIAGNOSIS — Z833 Family history of diabetes mellitus: Secondary | ICD-10-CM | POA: Diagnosis not present

## 2019-04-14 LAB — POCT URINALYSIS DIPSTICK OB
Glucose, UA: NEGATIVE
POC,PROTEIN,UA: NEGATIVE

## 2019-04-14 NOTE — Discharge Summary (Signed)
Physician Final Progress Note  Patient ID: Debra Wilson MRN: [id number] DOB/AGE: Aug 30, 1985 33 y.o.  Admit date: 04/14/2019 Admitting provider: Malachy Mood, MD Discharge date: 04/14/2019   Admission Diagnoses: IUP at 34 weeks, diet controlled gestational diabetes  Discharge Diagnoses:  Active Problems:   Indication for care in labor and delivery, antepartum Gestational Diabetes IUP at 34 weeks Reactive NST  History of Present Illness: The patient is a 33 y.o. female G3P1011 at 21w0dwho presents from the office for NST.  Unable to get accurate NST in the office due to fetal movement. Patient reports she is doing well. She admits good fetal movement. She denies leakage of fluid or vaginal bleeding. She denies contractions. She is having weekly NSTs due to GDM A1.   Past Medical History:  Diagnosis Date  . GDM (gestational diabetes mellitus)     Past Surgical History:  Procedure Laterality Date  . CESAREAN SECTION  2013   Gestation Diabetes/INduction    No current facility-administered medications on file prior to encounter.    Current Outpatient Medications on File Prior to Encounter  Medication Sig Dispense Refill  . Prenatal Vit-Fe Fumarate-FA (PRENATAL MULTIVITAMIN) TABS tablet Take 1 tablet by mouth daily at 12 noon.    .Marland Kitchenacetaminophen (TYLENOL) 500 MG tablet Take 1,000 mg by mouth every 6 (six) hours as needed for headache.    . glucose blood (ACCU-CHEK GUIDE) test strip Use as instructed 200 each 5    No Known Allergies  Social History   Socioeconomic History  . Marital status: Married    Spouse name: Not on file  . Number of children: Not on file  . Years of education: Not on file  . Highest education level: Not on file  Occupational History  . Not on file  Social Needs  . Financial resource strain: Not on file  . Food insecurity    Worry: Not on file    Inability: Not on file  . Transportation needs    Medical: Not on file    Non-medical: Not on  file  Tobacco Use  . Smoking status: Never Smoker  . Smokeless tobacco: Never Used  Substance and Sexual Activity  . Alcohol use: Not Currently    Comment: Occ  . Drug use: Never  . Sexual activity: Yes    Birth control/protection: None  Lifestyle  . Physical activity    Days per week: Not on file    Minutes per session: Not on file  . Stress: Not on file  Relationships  . Social [place] phone: Not on file    Gets together: Not on file    Attends religious service: Not on file    Active member of club or organization: Not on file    Attends meetings of clubs or organizations: Not on file    Relationship status: Not on file  . Intimate partner violence    Fear of current or ex partner: Not on file    Emotionally abused: Not on file    Physically abused: Not on file    Forced sexual activity: Not on file  Other Topics Concern  . Not on file  Social History Narrative   PEngineer, [employer]with [employer]   Family History  Problem Relation Age of Onset  . Multiple myeloma Mother   . Diabetes Mother      Review of Systems  Constitutional: Negative.   HENT: Negative.   Eyes: Negative.   Respiratory: Negative.  Cardiovascular: Negative.   Gastrointestinal: Negative.   Genitourinary: Negative.   Musculoskeletal: Negative.   Skin: Negative.   Neurological: Negative.   Endo/Heme/Allergies: Negative.   Psychiatric/Behavioral: Negative.      Physical Exam: BP 117/61 (BP Location: Left Arm)   Pulse 83   Temp 98.6 F (37 C) (Oral)   Resp 17   LMP 08/19/2018 (Approximate)   Constitutional: Well nourished, well developed female in no acute distress.  HEENT: normal Skin: Warm and dry.  Cardiovascular: Regular rate and rhythm.   Extremity: no edema  Respiratory: Clear to auscultation bilateral. Normal respiratory effort Abdomen: FHT present Neuro: DTRs 2+, Cranial nerves grossly intact Psych: Alert and Oriented x3. No memory deficits. Normal mood and  affect.  MS: normal gait, normal bilateral lower extremity ROM/strength/stability.  Fetal well being: Reactive NST with baseline 125, moderate variability, +accelerations to 160s and a couple of variable decelerations seen with rapid return to baseline Toco: negative for contractions Cervical exam deferred   Consults: None  Significant Findings/ Diagnostic Studies: none  Procedures: NST  Hospital Course: The patient was admitted to Labor and Delivery Triage for observation and NST.   Discharge Condition: good  Disposition: Discharge disposition: 01-Home or Self Care      Diet: Diabetic diet  Discharge Activity: Activity as tolerated  Discharge Instructions    Discharge activity:  No Restrictions   Complete by: As directed    Discharge diet:   Complete by: As directed    Healthy diabetic diet   Fetal Kick Count:  Lie on our left side for one hour after a meal, and count the number of times your baby kicks.  If it is less than 5 times, get up, move around and drink some juice.  Repeat the test 30 minutes later.  If it is still less than 5 kicks in an hour, notify your doctor.   Complete by: As directed    No sexual activity restrictions   Complete by: As directed    Notify physician for a general feeling that "something is not right"   Complete by: As directed    Notify physician for increase or change in vaginal discharge   Complete by: As directed    Notify physician for intestinal cramps, with or without diarrhea, sometimes described as "gas pain"   Complete by: As directed    Notify physician for leaking of fluid   Complete by: As directed    Notify physician for low, dull backache, unrelieved by heat or Tylenol   Complete by: As directed    Notify physician for menstrual like cramps   Complete by: As directed    Notify physician for pelvic pressure   Complete by: As directed    Notify physician for uterine contractions.  These may be painless and feel like the  uterus is tightening or the baby is  "balling up"   Complete by: As directed    Notify physician for vaginal bleeding   Complete by: As directed    PRETERM LABOR:  Includes any of the follwing symptoms that occur between 20 - [redacted] weeks gestation.  If these symptoms are not stopped, preterm labor can result in preterm delivery, placing your baby at risk   Complete by: As directed      Allergies as of 04/14/2019   No Known Allergies     Medication List    TAKE these medications   acetaminophen 500 MG tablet Commonly known as: TYLENOL Take 1,000 mg by mouth  every 6 (six) hours as needed for headache.   glucose blood test strip Commonly known as: Accu-Chek Guide Use as instructed   prenatal multivitamin Tabs tablet Take 1 tablet by mouth daily at 12 noon.      [place]. Go to.   Specialty: Obstetrics and Gynecology Why: regular scheduled prenatal appointment Contact information: [address] [place] [id number] [phone]          Total time spent taking care of this patient: 15 minutes  Signed: Rod Can, CNM  04/14/2019, 12:01 PM

## 2019-04-14 NOTE — Progress Notes (Signed)
No problems.  Please discuss flu shot - pt got really sick 52yrs ago when she took it. rj

## 2019-04-14 NOTE — OB Triage Note (Signed)
Pt sent from office for NSt. Pt reports she gets weekly NST for GDM. Pt denies LOF/CTX/bleeding. Reports +FM. No concerns. Will monitor pt

## 2019-04-15 NOTE — Progress Notes (Signed)
ROB/ NST at 34 weeks: GDMA1: Not hungry, skipping meals and CBGs. Fasting blood sugars: 88-95, 2 of 4 at 95; 2 hour pp range 101-156, with 4/9 elevated.  Weight up 1.5#. TWG 26# Encouraged to eat small amounts of food that are on diet and not skip meals NST: Questionable baseline FHR 135 with accelerations to 150s-170, and there appears to be variable decelerations to 80s-90s and at the end of the tracing there is marked variability GBT=51VO/ cephalic Sent to L&D for further monitoring NST/growth scan in 1 week  Wants to breast feed-Ready, Set, Baby information given to patient  Dalia Heading, CNM

## 2019-04-21 ENCOUNTER — Ambulatory Visit (INDEPENDENT_AMBULATORY_CARE_PROVIDER_SITE_OTHER): Payer: 59 | Admitting: Obstetrics and Gynecology

## 2019-04-21 ENCOUNTER — Ambulatory Visit (INDEPENDENT_AMBULATORY_CARE_PROVIDER_SITE_OTHER): Payer: 59

## 2019-04-21 ENCOUNTER — Other Ambulatory Visit: Payer: Self-pay

## 2019-04-21 VITALS — BP 132/84 | Wt 142.0 lb

## 2019-04-21 DIAGNOSIS — O34219 Maternal care for unspecified type scar from previous cesarean delivery: Secondary | ICD-10-CM

## 2019-04-21 DIAGNOSIS — O0990 Supervision of high risk pregnancy, unspecified, unspecified trimester: Secondary | ICD-10-CM

## 2019-04-21 DIAGNOSIS — Z135 Encounter for screening for eye and ear disorders: Secondary | ICD-10-CM | POA: Diagnosis not present

## 2019-04-21 DIAGNOSIS — O24410 Gestational diabetes mellitus in pregnancy, diet controlled: Secondary | ICD-10-CM

## 2019-04-21 DIAGNOSIS — Z3A35 35 weeks gestation of pregnancy: Secondary | ICD-10-CM

## 2019-04-21 DIAGNOSIS — E109 Type 1 diabetes mellitus without complications: Secondary | ICD-10-CM | POA: Diagnosis not present

## 2019-04-21 DIAGNOSIS — Z98891 History of uterine scar from previous surgery: Secondary | ICD-10-CM

## 2019-04-21 DIAGNOSIS — O09293 Supervision of pregnancy with other poor reproductive or obstetric history, third trimester: Secondary | ICD-10-CM

## 2019-04-21 DIAGNOSIS — H5210 Myopia, unspecified eye: Secondary | ICD-10-CM | POA: Diagnosis not present

## 2019-04-21 DIAGNOSIS — Z8632 Personal history of gestational diabetes: Secondary | ICD-10-CM

## 2019-04-21 DIAGNOSIS — Z362 Encounter for other antenatal screening follow-up: Secondary | ICD-10-CM

## 2019-04-21 NOTE — Progress Notes (Signed)
Routine Prenatal Care Visit  Subjective  Debra Wilson is a 33 y.o. G3P1011 at 5541w0d being seen today for ongoing prenatal care.  She is currently monitored for the following issues for this high-risk prCarney Cornersegnancy and has Supervision of high risk pregnancy, antepartum; History of gestational diabetes; History of cesarean delivery; and Gestational diabetes mellitus (GDM) in third trimester on their problem list.  ----------------------------------------------------------------------------------- Patient reports no complaints.   Contractions: Not present. Vag. Bleeding: None.  Movement: Present. Denies leaking of fluid.  ----------------------------------------------------------------------------------- The following portions of the patient's history were reviewed and updated as appropriate: allergies, current medications, past family history, past medical history, past social history, past surgical history and problem list. Problem list updated.   Objective  Blood pressure 132/84, weight 142 lb (64.4 kg), last menstrual period 08/19/2018. Pregravid weight 116 lb (52.6 kg) Total Weight Gain 26 lb (11.8 kg)  Body mass index is 28.68 kg/m.  Urinalysis:      Fetal Status: Fetal Heart Rate (bpm): 135 Fundal Height: 35 cm Movement: Present  Presentation: Vertex  General:  Alert, oriented and cooperative. Patient is in no acute distress.  Skin: Skin is warm and dry. No rash noted.   Cardiovascular: Normal heart rate noted  Respiratory: Normal respiratory effort, no problems with respiration noted  Abdomen: Soft, gravid, appropriate for gestational age. Pain/Pressure: Absent     Pelvic:  Cervical exam deferred        Extremities: Normal range of motion.     ental Status: Normal mood and affect. Normal behavior. Normal judgment and thought content.   Date Fasting Breakfast Lunch Dinner  9/14 100 156 136   9/15 95 115  129  9/16 90 103 101 109  9/17      9/18 88 87    9/19 95 170  (biscuit/tea)  119  9/20 82  120 95  9/21 99 100  127  9/22 95 96 98 125  9/23 99 103 102 126         [place] Ob Limited  Result Date: 04/15/2019 Patient Name: Carney CornersMarlen Pergola DOB: [birth year] MRN: [id number] ULTRASOUND REPORT Location: [facility] Date of Service: 04/14/2019 Indications:AFI Findings: Mason JimSingleton intrauterine pregnancy is visualized with FHR at 140 BPM. Fetal presentation is Cephalic. Placenta: anterior. Grade: 2 AFI: 10.0 cm Impression: 1. 3974w0d Viable Singleton Intrauterine pregnancy dated by previously established criteria. 2. AFI is 10.0 cm. Recommendations: 1.Clinical correlation with the patient's History and Physical Exam. Darlina GuysAbby M Clarke, RT There is a singleton gestation with normal amniotic fluid volume. The visualized fetal anatomical survey appears within normal limits within the resolution of ultrasound as described above.  It must be noted that a normal ultrasound is unable to rule out fetal aneuploidy.  Vena AustriaAndreas Shanekqua Schaper, MD, Merlinda FrederickFACOG [facility], [facility] 04/15/2019, 8:01 AM  [place] Ob Limited  Result Date: 04/11/2019 Patient Name: Carney CornersMarlen Vines DOB: [birth year] MRN: [id number] ULTRASOUND REPORT Location: [facility] Date of Service: 04/08/2019 Indications:AFI Findings: Mason JimSingleton intrauterine pregnancy is visualized with FHR at 152 BPM. Fetal presentation is Cephalic. Placenta: anterior. Grade: 2 AFI: 10.7 cm Impression: 1. 5247w1d Viable Singleton Intrauterine pregnancy dated by previously established criteria. 2. AFI is 10.7 cm. Recommendations: 1.Clinical correlation with the patient's History and Physical Exam. Deanna ArtisElyse S Fairbanks, RT There is a singleton gestation with normal amniotic fluid volume. The visualized fetal anatomical survey appears within normal limits within the resolution of ultrasound as described above.  It must be noted that a normal ultrasound is unable to  rule out fetal aneuploidy.  Vena Austria, MD, [facility], [facility]  US Ob Follow Up  Result Date: 04/21/2019 Patient Name: Colandra Ohanian DOB: Dec 20, 1985 MRN: [id number] ULTRASOUND REPORT Location: [facility] Date of Service: 04/21/2019 Indications:growth/afi Findings: Mason Jim intrauterine pregnancy is visualized with FHR at 147 BPM. Biometrics give an (U/S) Gestational age of [redacted]w[redacted]d and an (U/S) EDD of 05/28/2020; this correlates with the clinically established Estimated Date of Delivery: 05/26/19. Fetal presentation is Cephalic. Placenta: anterior. Grade: 2 AFI: 7.9 cm Growth percentile is 39.9. EFW: 2,498g (5lbs 8oz) Impression: 1. [redacted]w[redacted]d Viable Singleton Intrauterine pregnancy previously established criteria. 2. Growth is 39.9 %ile.  AFI is 7.9 cm. Recommendations: 1.Clinical correlation with the patient's History and Physical Exam. Darlina Guys, RT There is a singleton gestation with normal amniotic fluid volume. The fetal biometry correlates with established dating.  Limited fetal anatomy was performed.The visualized fetal anatomical survey appears within normal limits within the resolution of ultrasound as described above.  It must be noted that a normal ultrasound is unable to rule out fetal aneuploidy.  Vena Austria, MD, [facility], [facility] 04/21/2019, 10:26 AM  US Ob Follow Up  Result Date: 04/05/2019 Patient Name: Katlen Seyer DOB: 1986-03-05 MRN: [id number] ULTRASOUND REPORT Location: [facility] Date of Service: 03/30/2019 Indications:growth/afi Findings: Mason Jim intrauterine pregnancy is visualized with FHR at 141 BPM. Biometrics give an (U/S) Gestational age of [redacted]w[redacted]d and an (U/S) EDD of 05/26/2019; this correlates with the clinically established Estimated Date of Delivery: 05/26/19. Fetal presentation is Cephalic. Placenta: anterior. Grade: 2 AFI: 14.9 cm Growth percentile is 59.5%. EFW: 2028 g (4 lb 8 oz ) Impression: 1. [redacted]w[redacted]d Viable Singleton Intrauterine pregnancy previously established criteria. 2. Growth is  59.5 %ile.  AFI is 14.9 cm. Recommendations: 1.Clinical correlation with the patient's History and Physical Exam. Deanna Artis, RT Review of ULTRASOUND.    I have personally reviewed images and report of recent ultrasound done at [facility].    Plan of management to be discussed with patient. Annamarie Major, MD, FACOG [facility], [facility] 04/05/2019  12:42 PM    Assessment   32 y.o. G3P1011 at [redacted]w[redacted]d by  05/26/2019, by Last Menstrual Period presenting for routine prenatal visit  Plan   Pregnancy#2 Problems (from 08/19/18 to present)    Problem Noted Resolved   Gestational diabetes mellitus (GDM) in third trimester 11/24/2018 by Nadara Mustard, MD No   Overview Signed 11/24/2018  9:55 AM by Nadara Mustard, MD    Current Diabetic Medications:  None  [ ]  Aspirin 81 mg daily after 12 weeks; discontinue after 36 weeks (? A2/B GDM)  Required Referrals for A1GDM or A2GDM:(as of 11/24/18) [ x] Diabetes Education and 11/26/18 [ x] Nutrition Cousult  For A2/B GDM or higher classes of DM [ ]  Diabetes Education and Testing Supplies [ ]  Nutrition Counsult [ ]  Fetal ECHO after 22-24 weeks  [ ]  Eye exam for retina evaluation  [ ]  Baseline EKG [ ]  Child psychotherapist fetal growth every 4 weeks starting at 28 weeks [ ]  Twice weekly NST starting at [redacted] weeks gestation [ ]  Delivery planning contingent on fetal growth, AFI, glycemic control, and other co-morbidities but at least by 39 weeks  Baseline and surveillance labs (pulled in from [facility], refresh links as needed)  Lab Results  Component Value Date   CREATININE 0.62 09/30/2018   AST 18 09/30/2018   ALT 14 09/30/2018   TSH 0.816  09/30/2018   No results found for: HGBA1C  Antenatal Testing Class of DM U/S NST/AFI DELIVERY  Diabetes   A1 - good control - O24.410    A2 - good control - O24.419      A2  - poor control or poor compliance - O24.419, E11.65   (Macrosomia or polyhydramnios) **E11.65 is extra code for poor  control**    A2/B - O24.919  and B-C O24.319  Poor control B-C or D-R-F-T - O24.319  or  Type I DM - O24.019  20-38  20-38  20-24-28-32-36   20-24-28-32-35-38//fetal echo  20-24-27-30-33-36-38//fetal echo  40  32//2 x wk  32//2 x wk   32//2 x wk  28//BPP wkly then 32//2 x wk  40  39  PRN   39  PRN          History of cesarean delivery 10/18/2018 by Gae Dry, MD No   Supervision of high risk pregnancy, antepartum 10/11/2018 by Rod Can, CNM No   Overview Addendum 03/16/2019 11:46 AM by Homero Fellers, MD    Clinic [facility] Prenatal Labs  Dating WS 8 weeks [place] Blood type: B/Positive/-- (03/16 0957)   Genetic Screen    NIPS: normal Antibody:Negative (03/16 0957)  Anatomic [place] complete Rubella: 1.65 (03/16 0957) Varicella: Immune  GTT Early: 190; abnormal 3 hr GTT  RPR: Non Reactive (03/16 0957)   Rhogam B POS HBsAg: Negative (03/16 0957)   TDaP vaccine  8/19/2020Flu Shot: declined HIV: Non Reactive (03/16 0957)   Baby Food  Breast                              GBS:   Contraception  Pap: 3/16  CBB     CS/VBAC 2013- arrest of descent   Support Person [place]              Gestational age appropriate obstetric precautions including but not limited to vaginal bleeding, contractions, leaking of fluid and fetal movement were reviewed in detail with the patient.    - BG well controlled on diet, slight upward trend noted in dinner postprandials.  Monitor at present no need for oral anti-hyperglycemic or insulin - Growth today 39%ile  Return in about 1 week (around 04/28/2019) for ROB.  Malachy Mood, MD, [facility], [facility] 04/21/2019, 10:22 AM

## 2019-04-21 NOTE — Progress Notes (Signed)
ROB AFI/NST 

## 2019-04-28 ENCOUNTER — Other Ambulatory Visit (HOSPITAL_COMMUNITY)
Admission: RE | Admit: 2019-04-28 | Discharge: 2019-04-28 | Disposition: A | Discharge status: Home / Self Care | Payer: 59 | Source: Ambulatory Visit | Attending: Obstetrics and Gynecology | Admitting: Obstetrics and Gynecology

## 2019-04-28 ENCOUNTER — Encounter: Payer: 59 | Admitting: Obstetrics and Gynecology

## 2019-04-28 ENCOUNTER — Ambulatory Visit (INDEPENDENT_AMBULATORY_CARE_PROVIDER_SITE_OTHER): Payer: 59 | Admitting: Obstetrics and Gynecology

## 2019-04-28 ENCOUNTER — Other Ambulatory Visit: Payer: Self-pay

## 2019-04-28 VITALS — BP 126/74 | Wt 144.0 lb

## 2019-04-28 DIAGNOSIS — O24410 Gestational diabetes mellitus in pregnancy, diet controlled: Secondary | ICD-10-CM

## 2019-04-28 DIAGNOSIS — Z3A36 36 weeks gestation of pregnancy: Secondary | ICD-10-CM

## 2019-04-28 DIAGNOSIS — O0990 Supervision of high risk pregnancy, unspecified, unspecified trimester: Secondary | ICD-10-CM | POA: Diagnosis not present

## 2019-04-28 DIAGNOSIS — O34219 Maternal care for unspecified type scar from previous cesarean delivery: Secondary | ICD-10-CM | POA: Diagnosis not present

## 2019-04-28 DIAGNOSIS — Z98891 History of uterine scar from previous surgery: Secondary | ICD-10-CM

## 2019-04-28 DIAGNOSIS — K2100 Gastro-esophageal reflux disease with esophagitis, without bleeding: Secondary | ICD-10-CM

## 2019-04-28 DIAGNOSIS — O0993 Supervision of high risk pregnancy, unspecified, third trimester: Secondary | ICD-10-CM

## 2019-04-28 MED ORDER — SUCRALFATE 1 G PO TABS: 1.0000 g | ORAL_TABLET | Freq: Three times a day (TID) | ORAL | 6 refills | Status: DC

## 2019-04-28 NOTE — Progress Notes (Signed)
No vb. No lof. Pt having some braxton hicks. GBS today

## 2019-04-28 NOTE — Progress Notes (Signed)
Routine Prenatal Care Visit  Subjective  Debra Wilson is a 33 y.o. G3P1011 at [redacted]w[redacted]d being seen today for ongoing prenatal care.  She is currently monitored for the following issues for this high-risk pregnancy and has Supervision of high risk pregnancy, antepartum; History of gestational diabetes; History of cesarean delivery; and Gestational diabetes mellitus (GDM) in third trimester on their problem list.  ----------------------------------------------------------------------------------- Patient reports no complaints.   Contractions: Irritability. Vag. Bleeding: None.  Movement: Present. Denies leaking of fluid.  ----------------------------------------------------------------------------------- The following portions of the patient's history were reviewed and updated as appropriate: allergies, current medications, past family history, past medical history, past social history, past surgical history and problem list. Problem list updated.   Objective  Blood pressure 126/74, weight 144 lb (65.3 kg), last menstrual period 08/19/2018. Pregravid weight 116 lb (52.6 kg) Total Weight Gain 28 lb (12.7 kg) Urinalysis:      Fetal Status: Fetal Heart Rate (bpm): 150   Movement: Present  Presentation: Vertex  General:  Alert, oriented and cooperative. Patient is in no acute distress.  Skin: Skin is warm and dry. No rash noted.   Cardiovascular: Normal heart rate noted  Respiratory: Normal respiratory effort, no problems with respiration noted  Abdomen: Soft, gravid, appropriate for gestational age. Pain/Pressure: Present     Pelvic:  Cervical exam performed Dilation: Closed Effacement (%): 50 Station: -3  Extremities: Normal range of motion.     Mental Status: Normal mood and affect. Normal behavior. Normal judgment and thought content.     NST: 145 bpm baseline, moderate variability, 15x15 accelerations, no decelerations.   Assessment   33 y.o. G3P1011 at [redacted]w[redacted]d by  05/26/2019, by  Last Menstrual Period presenting for routine prenatal visit  Plan   Pregnancy#2 Problems (from 08/19/18 to present)    Problem Noted Resolved   Gestational diabetes mellitus (GDM) in third trimester 11/24/2018 by Gae Dry, MD No   Overview Signed 11/24/2018  9:55 AM by Gae Dry, MD    Current Diabetic Medications:  None  [ ]  Aspirin 81 mg daily after 12 weeks; discontinue after 36 weeks (? A2/B GDM)  Required Referrals for A1GDM or A2GDM:(as of 11/24/18) [ x] Diabetes Education and Equities trader [ x] Nutrition Cousult  For A2/B GDM or higher classes of DM [ ]  Diabetes Education and Testing Supplies [ ]  Nutrition Counsult [ ]  Fetal ECHO after 22-24 weeks  [ ]  Eye exam for retina evaluation  [ ]  Baseline EKG [ ]  US fetal growth every 4 weeks starting at 28 weeks [ ]  Twice weekly NST starting at [redacted] weeks gestation [ ]  Delivery planning contingent on fetal growth, AFI, glycemic control, and other co-morbidities but at least by 39 weeks  Baseline and surveillance labs (pulled in from [facility], refresh links as needed)  Lab Results  Component Value Date   CREATININE 0.62 09/30/2018   AST 18 09/30/2018   ALT 14 09/30/2018   TSH 0.816 09/30/2018   No results found for: HGBA1C  Antenatal Testing Class of DM U/S NST/AFI DELIVERY  Diabetes   A1 - good control - O24.410    A2 - good control - O24.419      A2  - poor control or poor compliance - O24.419, E11.65   (Macrosomia or polyhydramnios) **E11.65 is extra code for poor control**    A2/B - O24.919  and B-C O24.319  Poor control B-C or D-R-F-T - O24.319  or  Type I DM - O24.019  20-38  20-38  20-24-28-32-36   20-24-28-32-35-38//fetal echo  20-24-27-30-33-36-38//fetal echo  40  32//2 x wk  32//2 x wk   32//2 x wk  28//BPP wkly then 32//2 x wk  40  39  PRN   39  PRN          History of cesarean delivery 10/18/2018 by Nadara Mustard, MD No   Supervision of high risk pregnancy,  antepartum 10/11/2018 by Tresea Mall, CNM No   Overview Addendum 03/16/2019 11:46 AM by Natale Milch, MD    Clinic [facility] Prenatal Labs  Dating WS 8 weeks [place] Blood type: B/Positive/-- (03/16 0957)   Genetic Screen    NIPS: normal Antibody:Negative (03/16 0957)  Anatomic [place] complete Rubella: 1.65 (03/16 0957) Varicella: Immune  GTT Early: 190; abnormal 3 hr GTT  RPR: Non Reactive (03/16 0957)   Rhogam B POS HBsAg: Negative (03/16 0957)   TDaP vaccine  8/19/2020Flu Shot: declined HIV: Non Reactive (03/16 0957)   Baby Food  Breast                              GBS:   Contraception  Pap: 3/16  CBB     CS/VBAC 2013- arrest of descent   Support Person [facility] [facility]              Gestational age appropriate obstetric precautions including but not limited to vaginal bleeding, contractions, leaking of fluid and fetal movement were reviewed in detail with the patient.    Return in about 1 week (around 05/05/2019) for ROB.  Natale Milch MD [facility], [facility] 04/28/2019, 9:36 AM

## 2019-05-02 LAB — CERVICOVAGINAL ANCILLARY ONLY
Chlamydia: NEGATIVE
Neisseria Gonorrhea: NEGATIVE

## 2019-05-02 LAB — CULTURE, BETA STREP (GROUP B ONLY): Strep Gp B Culture: NEGATIVE

## 2019-05-02 NOTE — Progress Notes (Signed)
GBS negative, released to mychart with note.

## 2019-05-03 NOTE — Progress Notes (Signed)
WNL, released to mychart with note

## 2019-05-06 ENCOUNTER — Other Ambulatory Visit: Payer: Self-pay

## 2019-05-06 ENCOUNTER — Ambulatory Visit (INDEPENDENT_AMBULATORY_CARE_PROVIDER_SITE_OTHER): Payer: 59 | Admitting: Certified Nurse Midwife

## 2019-05-06 ENCOUNTER — Encounter: Payer: Self-pay | Admitting: Certified Nurse Midwife

## 2019-05-06 VITALS — BP 110/70 | Wt 147.0 lb

## 2019-05-06 DIAGNOSIS — O0993 Supervision of high risk pregnancy, unspecified, third trimester: Secondary | ICD-10-CM | POA: Diagnosis not present

## 2019-05-06 DIAGNOSIS — O34219 Maternal care for unspecified type scar from previous cesarean delivery: Secondary | ICD-10-CM | POA: Diagnosis not present

## 2019-05-06 DIAGNOSIS — O0990 Supervision of high risk pregnancy, unspecified, unspecified trimester: Secondary | ICD-10-CM

## 2019-05-06 DIAGNOSIS — Z98891 History of uterine scar from previous surgery: Secondary | ICD-10-CM

## 2019-05-06 DIAGNOSIS — Z23 Encounter for immunization: Secondary | ICD-10-CM | POA: Diagnosis not present

## 2019-05-06 DIAGNOSIS — Z3A37 37 weeks gestation of pregnancy: Secondary | ICD-10-CM

## 2019-05-06 DIAGNOSIS — O24410 Gestational diabetes mellitus in pregnancy, diet controlled: Secondary | ICD-10-CM | POA: Diagnosis not present

## 2019-05-06 LAB — POCT URINALYSIS DIPSTICK OB
Glucose, UA: NEGATIVE
POC,PROTEIN,UA: NEGATIVE

## 2019-05-06 NOTE — Progress Notes (Signed)
No complaints. Will take flu shot today.rj

## 2019-05-08 ENCOUNTER — Encounter: Payer: Self-pay | Admitting: Certified Nurse Midwife

## 2019-05-08 NOTE — Progress Notes (Signed)
ROB/ NST at 37wk1d: GDMA1: Felling well. Baby active. Checking CBGs: 2 of 8 fasting blood sugars elevated with range 80-105 8 of 19  two hr pp values elevated with range 84-153. The two highest values of 146,153 after eating a sub and a biscuit ([place]).  Last growth scan 9/24 with EFW 39.9% with AFI 7.9cm Wants to breast feed Contraception: Nexplanon NST reactive with baseline 145 and accelerations to 170s, moderate variability  A/ P: IUP at 37 wk 1d Reactive NST GDMA1: Less than 50% of CBGs elevated (admits to dietary indiscretions) Encourage sticking to diet ROB/NST/AFI in 1 week Repeat CS scheduled for 10/29  Dalia Heading, CNM

## 2019-05-12 ENCOUNTER — Other Ambulatory Visit: Payer: Self-pay

## 2019-05-12 ENCOUNTER — Ambulatory Visit (INDEPENDENT_AMBULATORY_CARE_PROVIDER_SITE_OTHER): Payer: 59

## 2019-05-12 DIAGNOSIS — O0990 Supervision of high risk pregnancy, unspecified, unspecified trimester: Secondary | ICD-10-CM

## 2019-05-12 DIAGNOSIS — Z3A38 38 weeks gestation of pregnancy: Secondary | ICD-10-CM | POA: Diagnosis not present

## 2019-05-12 DIAGNOSIS — O24410 Gestational diabetes mellitus in pregnancy, diet controlled: Secondary | ICD-10-CM | POA: Diagnosis not present

## 2019-05-13 ENCOUNTER — Ambulatory Visit (INDEPENDENT_AMBULATORY_CARE_PROVIDER_SITE_OTHER): Payer: 59 | Admitting: Obstetrics & Gynecology

## 2019-05-13 ENCOUNTER — Encounter: Payer: Self-pay | Admitting: Obstetrics & Gynecology

## 2019-05-13 VITALS — BP 120/80 | Wt 149.0 lb

## 2019-05-13 DIAGNOSIS — Z3A38 38 weeks gestation of pregnancy: Secondary | ICD-10-CM

## 2019-05-13 DIAGNOSIS — O34219 Maternal care for unspecified type scar from previous cesarean delivery: Secondary | ICD-10-CM

## 2019-05-13 DIAGNOSIS — O24410 Gestational diabetes mellitus in pregnancy, diet controlled: Secondary | ICD-10-CM

## 2019-05-13 DIAGNOSIS — O0990 Supervision of high risk pregnancy, unspecified, unspecified trimester: Secondary | ICD-10-CM

## 2019-05-13 DIAGNOSIS — Z98891 History of uterine scar from previous surgery: Secondary | ICD-10-CM

## 2019-05-13 DIAGNOSIS — O0993 Supervision of high risk pregnancy, unspecified, third trimester: Secondary | ICD-10-CM

## 2019-05-13 NOTE — Progress Notes (Signed)
Subjective  Fetal Movement? yes Contractions? Yes, B-Hs over the weekend, rare now Leaking Fluid? no Vaginal Bleeding? no BS WNL Objective  BP 120/80   Wt 149 lb (67.6 kg)   LMP 08/19/2018 (Approximate)   BMI 30.09 kg/m  General: NAD Pumonary: no increased work of breathing Abdomen: gravid, non-tender Extremities: no edema Psychiatric: mood appropriate, affect full  A NST procedure was performed with FHR monitoring and a normal baseline established, appropriate time of 20-40 minutes of evaluation, and accels >2 seen w 15x15 characteristics.  Results show a REACTIVE NST.   Assessment  33 y.o. [id number] at [redacted]w[redacted]d by  05/26/2019, by Last Menstrual Period presenting for routine prenatal visit  Plan   Problem List Items Addressed This Visit      Endocrine   Gestational diabetes mellitus (GDM) in third trimester   Relevant Orders   US OB Limited     Other   Supervision of high risk pregnancy, antepartum   History of cesarean delivery    Other Visit Diagnoses    [redacted] weeks gestation of pregnancy    -  Primary      Pregnancy#2 Problems (from 08/19/18 to present)    Problem Noted Resolved   Gestational diabetes mellitus (GDM) in third trimester 11/24/2018 by Gae Dry, MD No   Overview Signed 11/24/2018  9:55 AM by Gae Dry, MD    Current Diabetic Medications:  None  Required Referrals for A1GDM or A2GDM:(as of 11/24/18) [ x] Diabetes Education and Testing Supplies [ x] Nutrition Cousult Baseline and surveillance labs (pulled in from [facility], refresh links as needed)  Lab Results  Component Value Date   CREATININE 0.62 09/30/2018   AST 18 09/30/2018   ALT 14 09/30/2018   TSH 0.816 09/30/2018   Antenatal Testing Class of DM U/S NST/AFI DELIVERY  Diabetes   A1 - good control - O24.410    A2 - good control - O24.419      A2  - poor control or poor compliance - O24.419, E11.65   (Macrosomia or polyhydramnios) **E11.65 is extra code for poor control**   A2/B - O24.919  and B-C O24.319  Poor control B-C or D-R-F-T - O24.319  or  Type I DM - O24.019  20-38  20-38  20-24-28-32-36   20-24-28-32-35-38//fetal echo  20-24-27-30-33-36-38//fetal echo  40  32//2 x wk  32//2 x wk   32//2 x wk  28//BPP wkly then 32//2 x wk  40  39  PRN   39  PRN       History of cesarean delivery 10/18/2018 by Gae Dry, MD No   Overview Signed 05/08/2019  7:18 PM by Dalia Heading, CNM    Four hour second stage (FTP)      Supervision of high risk pregnancy, antepartum 10/11/2018 by Rod Can, CNM No   Overview Addendum 03/16/2019 11:46 AM by Homero Fellers, MD    [facility]inic [facility] Prenatal Labs  Dating WS 8 weeks [place] Blood type: B/Positive/-- (03/16 0957)   Genetic Screen    NIPS: normal Antibody:Negative (03/16 0957)  Anatomic [place] complete Rubella: 1.65 (03/16 0957) Varicella: Immune  GTT Early: 190; abnormal 3 hr GTT  RPR: Non Reactive (03/16 0957)   Rhogam B POS HBsAg: Negative (03/16 0957)   TDaP vaccine  8/19/2020Flu Shot: declined HIV: Non Reactive (03/16 0957)   Baby Food  Breast  GBS: NEG  Contraception  Pap: 3/16  CBB  No   CS/VBAC 2013- arrest of descent   Support Person [facility] [facility]            CS vs VBAC discussed if presents in labor      CS planned 10/29  PNV  [facility]   Annamarie Major, MD, FACOG [facility], [facility] 05/13/2019  3:39 PM

## 2019-05-13 NOTE — Patient Instructions (Signed)
Braxton Hicks Contractions Contractions of the uterus can occur throughout pregnancy, but they are not always a sign that you are in labor. You may have practice contractions called Braxton Hicks contractions. These false labor contractions are sometimes confused with true labor. What are Braxton Hicks contractions? Braxton Hicks contractions are tightening movements that occur in the muscles of the uterus before labor. Unlike true labor contractions, these contractions do not result in opening (dilation) and thinning of the cervix. Toward the end of pregnancy (32-34 weeks), Braxton Hicks contractions can happen more often and may become stronger. These contractions are sometimes difficult to tell apart from true labor because they can be very uncomfortable. You should not feel embarrassed if you go to the hospital with false labor. Sometimes, the only way to tell if you are in true labor is for your health care provider to look for changes in the cervix. The health care provider will do a physical exam and may monitor your contractions. If you are not in true labor, the exam should show that your cervix is not dilating and your water has not broken. If there are no other health problems associated with your pregnancy, it is completely safe for you to be sent home with false labor. You may continue to have Braxton Hicks contractions until you go into true labor. How to tell the difference between true labor and false labor True labor  Contractions last 30-70 seconds.  Contractions become very regular.  Discomfort is usually felt in the top of the uterus, and it spreads to the lower abdomen and low back.  Contractions do not go away with walking.  Contractions usually become more intense and increase in frequency.  The cervix dilates and gets thinner. False labor  Contractions are usually shorter and not as strong as true labor contractions.  Contractions are usually irregular.  Contractions  are often felt in the front of the lower abdomen and in the groin.  Contractions may go away when you walk around or change positions while lying down.  Contractions get weaker and are shorter-lasting as time goes on.  The cervix usually does not dilate or become thin. Follow these instructions at home:   Take over-the-counter and prescription medicines only as told by your health care provider.  Keep up with your usual exercises and follow other instructions from your health care provider.  Eat and drink lightly if you think you are going into labor.  If Braxton Hicks contractions are making you uncomfortable: ? Change your position from lying down or resting to walking, or change from walking to resting. ? Sit and rest in a tub of warm water. ? Drink enough fluid to keep your urine pale yellow. Dehydration may cause these contractions. ? Do slow and deep breathing several times an hour.  Keep all follow-up prenatal visits as told by your health care provider. This is important. Contact a health care provider if:  You have a fever.  You have continuous pain in your abdomen. Get help right away if:  Your contractions become stronger, more regular, and closer together.  You have fluid leaking or gushing from your vagina.  You pass blood-tinged mucus (bloody show).  You have bleeding from your vagina.  You have low back pain that you never had before.  You feel your baby's head pushing down and causing pelvic pressure.  Your baby is not moving inside you as much as it used to. Summary  Contractions that occur before labor are   called Braxton Hicks contractions, false labor, or practice contractions.  Braxton Hicks contractions are usually shorter, weaker, farther apart, and less regular than true labor contractions. True labor contractions usually become progressively stronger and regular, and they become more frequent.  Manage discomfort from Braxton Hicks contractions  by changing position, resting in a warm bath, drinking plenty of water, or practicing deep breathing. This information is not intended to replace advice given to you by your health care provider. Make sure you discuss any questions you have with your health care provider. Document Released: 11/27/2016 Document Revised: 06/26/2017 Document Reviewed: 11/27/2016 Elsevier Patient Education  2020 [employer]  

## 2019-05-23 ENCOUNTER — Other Ambulatory Visit: Payer: Self-pay

## 2019-05-23 ENCOUNTER — Ambulatory Visit (INDEPENDENT_AMBULATORY_CARE_PROVIDER_SITE_OTHER): Payer: 59 | Admitting: Obstetrics and Gynecology

## 2019-05-23 ENCOUNTER — Encounter: Payer: Self-pay | Admitting: Obstetrics and Gynecology

## 2019-05-23 ENCOUNTER — Encounter
Admission: RE | Admit: 2019-05-23 | Discharge: 2019-05-23 | Disposition: A | Discharge status: Home / Self Care | Payer: 59 | Source: Ambulatory Visit | Attending: Obstetrics and Gynecology | Admitting: Obstetrics and Gynecology

## 2019-05-23 ENCOUNTER — Ambulatory Visit (INDEPENDENT_AMBULATORY_CARE_PROVIDER_SITE_OTHER): Payer: 59

## 2019-05-23 VITALS — BP 118/76 | Wt 150.0 lb

## 2019-05-23 DIAGNOSIS — Z98891 History of uterine scar from previous surgery: Secondary | ICD-10-CM

## 2019-05-23 DIAGNOSIS — O0990 Supervision of high risk pregnancy, unspecified, unspecified trimester: Secondary | ICD-10-CM

## 2019-05-23 DIAGNOSIS — Z3A39 39 weeks gestation of pregnancy: Secondary | ICD-10-CM | POA: Diagnosis not present

## 2019-05-23 DIAGNOSIS — O24410 Gestational diabetes mellitus in pregnancy, diet controlled: Secondary | ICD-10-CM

## 2019-05-23 DIAGNOSIS — O34219 Maternal care for unspecified type scar from previous cesarean delivery: Secondary | ICD-10-CM

## 2019-05-23 HISTORY — DX: Gastro-esophageal reflux disease without esophagitis: K21.9

## 2019-05-23 NOTE — Progress Notes (Signed)
OB History & Physical   History of Present Illness:  Chief Complaint: Pre-operative visit for repeat cesarean section  HPI:  Debra Wilson is a 33 y.o. G3P1011 female at [redacted]w[redacted]d dated by LMP consistent with an 8 week ultrasound.  Her pregnancy has been complicated by gestational diabetes, diet controlled, history of c-section after 4 hours 2nd stage, anxiety.    She denies contractions.   She denies leakage of fluid.   She denies vaginal bleeding.   She reports fetal movement.    Total weight gain for pregnancy: 33 lb (15 kg)   Obstetrical Problem List: Pregnancy#2 Problems (from 08/19/18 to present)    Problem Noted Resolved   Gestational diabetes mellitus (GDM) in third trimester 11/24/2018 by Harris, Robert P, MD No   Overview Signed 11/24/2018  9:55 AM by Harris, Robert P, MD    Current Diabetic Medications:  None  [ ] Aspirin 81 mg daily after 12 weeks; discontinue after 36 weeks (? A2/B GDM)  Required Referrals for A1GDM or A2GDM:(as of 11/24/18) [ x] Diabetes Education and Testing Supplies [ x] Nutrition Cousult  For A2/B GDM or higher classes of DM [ ] Diabetes Education and Testing Supplies [ ] Nutrition Counsult [ ] Fetal ECHO after 22-24 weeks  [ ] Eye exam for retina evaluation  [ ] Baseline EKG [ ] US fetal growth every 4 weeks starting at 28 weeks [ ] Twice weekly NST starting at [redacted] weeks gestation [ ] Delivery planning contingent on fetal growth, AFI, glycemic control, and other co-morbidities but at least by 39 weeks  Baseline and surveillance labs (pulled in from EPIC, refresh links as needed)  Lab Results  Component Value Date   CREATININE 0.62 09/30/2018   AST 18 09/30/2018   ALT 14 09/30/2018   TSH 0.816 09/30/2018   No results found for: HGBA1C  Antenatal Testing Class of DM U/S NST/AFI DELIVERY  Diabetes   A1 - good control - O24.410    A2 - good control - O24.419      A2  - poor control or poor compliance - O24.419, E11.65   (Macrosomia or  polyhydramnios) **E11.65 is extra code for poor control**    A2/B - O24.919  and B-C O24.319  Poor control B-C or D-R-F-T - O24.319  or  Type I DM - O24.019  20-38  20-38  20-24-28-32-36   20-24-28-32-35-38//fetal echo  20-24-27-30-33-36-38//fetal echo  40  32//2 x wk  32//2 x wk   32//2 x wk  28//BPP wkly then 32//2 x wk  40  39  PRN   39  PRN          History of cesarean delivery 10/18/2018 by Harris, Robert P, MD No   Overview Signed 05/08/2019  7:18 PM by Gutierrez, Colleen, CNM    Four hour second stage (FTP)      Supervision of high risk pregnancy, antepartum 10/11/2018 by Gledhill, Jane, CNM No   Overview Addendum 03/16/2019 11:46 AM by Schuman, Christanna R, MD    Clinic [facility] Prenatal Labs  Dating WS 8 weeks US Blood type: B/Positive/-- (03/16 0957)   Genetic Screen    NIPS: normal Antibody:Negative (03/16 0957)  Anatomic US complete Rubella: 1.65 (03/16 0957) Varicella: Immune  GTT Early: 190; abnormal 3 hr GTT  RPR: Non Reactive (03/16 0957)   Rhogam B POS HBsAg: Negative (03/16 0957)   TDaP vaccine  8/19/2020Flu Shot: declined HIV: Non Reactive (03/16 0957)   Baby Food  Breast                                GBS: negative on 10/1  Contraception  Pap: 3/16  CBB   GC/CT: neg/neg  CS/VBAC 2013- arrest of descent   Support Person [name]              Maternal Medical History:   Past Medical History:  Diagnosis Date  . GDM (gestational diabetes mellitus)     Past Surgical History:  Procedure Laterality Date  . CESAREAN SECTION  2013   Gestation Diabetes/INduction    No Known Allergies  Prior to Admission medications   Medication Sig Start Date End Date Taking? Authorizing Provider  acetaminophen (TYLENOL) 500 MG tablet Take 1,000 mg by mouth every 6 (six) hours as needed for headache.    [provider]  glucose blood (ACCU-CHEK GUIDE) test strip Use as instructed 12/31/18   Harris, Robert P, MD  Prenatal Vit-Fe  Fumarate-FA (PRENATAL MULTIVITAMIN) TABS tablet Take 1 tablet by mouth daily.     [provider]  sucralfate (CARAFATE) 1 g tablet Take 1 tablet (1 g total) by mouth 4 (four) times daily -  with meals and at bedtime. Patient taking differently: Take 1 g by mouth 3 (three) times daily as needed (acid reflux).  04/28/19   Schuman, Christanna R, MD    OB History  Gravida Para Term Preterm AB Living  3 1 1   1 1  SAB TAB Ectopic Multiple Live Births  1       1    # Outcome Date GA Lbr Len/2nd Weight Sex Delivery Anes PTL Lv  3 Current           2 Term 03/14/12 [redacted]w[redacted]d  7 lb (3.175 kg) F CS-LTranv  N LIV     Complications: Failure to Progress in Second Stage, GDM (gestational diabetes mellitus)  1 SAB             Prenatal care site: [facility]  Social History: She  reports that she has never smoked. She has never used smokeless tobacco. She reports previous alcohol use. She reports that she does not use drugs.  Family History: family history includes Diabetes in her mother; Multiple myeloma in her mother.   Review of Systems:  Review of Systems  Constitutional: Negative.   HENT: Negative.   Eyes: Negative.   Respiratory: Negative.   Cardiovascular: Negative.   Gastrointestinal: Negative.   Genitourinary: Negative.   Musculoskeletal: Negative.   Skin: Negative.   Neurological: Negative.   Psychiatric/Behavioral: Negative.      Physical Exam:  BP 118/76   Wt 150 lb (68 kg)   LMP 08/19/2018 (Approximate)   BMI 30.30 kg/m   Physical Exam Constitutional:      General: She is not in acute distress.    Appearance: Normal appearance. She is well-developed.  HENT:     Head: Normocephalic and atraumatic.  Eyes:     General: No scleral icterus.    Conjunctiva/sclera: Conjunctivae normal.  Neck:     Musculoskeletal: Normal range of motion and neck supple.  Cardiovascular:     Rate and Rhythm: Normal rate and regular rhythm.     Heart sounds: No murmur. No friction  rub. No gallop.   Pulmonary:     Effort: Pulmonary effort is normal. No respiratory distress.     Breath sounds: Normal breath sounds. No wheezing or rales.  Abdominal:     General: Bowel sounds are normal. There is no distension.     Palpations: Abdomen is soft.     Tenderness: There   is no abdominal tenderness. There is no guarding or rebound.     Comments: Gravid, nt  Musculoskeletal: Normal range of motion.  Neurological:     General: No focal deficit present.     Mental Status: She is alert and oriented to person, place, and time.     Cranial Nerves: No cranial nerve deficit.  Skin:    General: Skin is warm and dry.     Findings: No erythema.  Psychiatric:        Mood and Affect: Mood normal.        Behavior: Behavior normal.        Judgment: Judgment normal.    Baseline FHR: 135 beats/min   Variability: moderate   Accelerations: present   Decelerations: absent Contractions: not done Overall assessment: cat 1, reactive  Imaging Results Us Ob Limited  Result Date: 05/23/2019 Patient Name: Gianah Haigh DOB: 06/09/1986 MRN: [id number] ULTRASOUND REPORT Location: [facility] Date of Service: 05/23/2019 Indications:AFI Findings: Singleton intrauterine pregnancy is visualized with FHR at 148 BPM. Fetal presentation is Cephalic. Placenta: anterior. Grade: 3 AFI: 14.6 cm Impression: 1. [redacted]w[redacted]d Viable Singleton Intrauterine pregnancy dated by previously established criteria. 2. AFI is 14.6 cm. Elyse S Fairbanks, RT The ultrasound images and findings were reviewed by me and I agree with the above report. Roxy Mastandrea, MD, FACOG [facility], [facility] 05/23/2019 9:23 AM       No results found for: SARSCOV2NAA]  Assessment:  Debra Wilson is a 32 y.o. G3P1011 female at [redacted]w[redacted]d with history of cesarean delivery.   Plan:  1. Admit to Labor & Delivery  2. CBC, T&S, Clrs, IVF 3. GBS negative.   4. Fetwal well-being: reassuring 5. She has been consented for  c-section. She would like to attempt a TOLAC, if she goes into spontaneous labor. She has previously been thoroughly counseled regarding the risks and benefits of this approach.    Kameron Glazebrook, MD 05/23/2019 9:19 AM    

## 2019-05-23 NOTE — Patient Instructions (Signed)
Your procedure is scheduled on: thurs 10/29 Report to the medical mall at 5:30    Call [phone] and someone from L&D will escorted you and Debra Wilson to L&D   Remember: Instructions that are not followed completely may result in serious medical risk,  up to and including death, or upon the discretion of your surgeon and anesthesiologist your  surgery may need to be rescheduled.     _X__ 1. Do not eat food after midnight the night before your procedure.                 No gum chewing or hard candies. You may drink clear liquids up to 2 hours                 before you are scheduled to arrive for your surgery- DO not drink clear                 liquids within 2 hours of the start of your surgery.                 Clear Liquids include:  water, apple juice without pulp, clear carbohydrate                 drink such as Clearfast of Gatorade, Black Coffee or Tea (Do not add                 anything to coffee or tea).  __X__2.  On the morning of surgery brush your teeth with toothpaste and water, you                may rinse your mouth with mouthwash if you wish.  Do not swallow any toothpaste of mouthwash.     ___ 3.  No Alcohol for 24 hours before or after surgery.   ___ 4.  Do Not Smoke or use e-cigarettes For 24 Hours Prior to Your Surgery.                 Do not use any chewable tobacco products for at least 6 hours prior to                 surgery.  ____  5.  Bring all medications with you on the day of surgery if instructed.   _x___  6.  Notify your doctor if there is any change in your medical condition      (cold, fever, infections).     Do not wear jewelry, make-up, hairpins, clips or nail polish. Do not wear lotions, powders, or perfumes. You may wear deodorant. Do not shave 48 hours prior to surgery. Men may shave face and neck. Do not bring valuables to the hospital.    [facility] is not responsible for any belongings or valuables.  Contacts,  dentures or bridgework may not be worn into surgery. Leave your suitcase in the car. After surgery it may be brought to your room. For patients admitted to the hospital, discharge time is determined by your treatment team.   Patients discharged the day of surgery will not be allowed to drive home.   Please read over the following fact sheets that you were given:     __x__ Take these medicines the morning of surgery with A SIP OF WATER:    1. none  2.   3.   4.  5.  6.  ____ Fleet Enema (as directed)   _x___ Use CHG Soap as directed  ____ Use inhalers  on the day of surgery  ____ Stop metformin 2 days prior to surgery    ____ Take 1/2 of usual insulin dose the night before surgery. No insulin the morning          of surgery.   ____ Stop Coumadin/Plavix/aspirin on   ____ Stop Anti-inflammatories on    ____ Stop supplements until after surgery.    ____ Bring C-Pap to the hospital.

## 2019-05-23 NOTE — H&P (View-Only) (Signed)
OB History & Physical   History of Present Illness:  Chief Complaint: Pre-operative visit for repeat cesarean section  HPI:  Debra Wilson is a 33 y.o. G60P1011 female at 51w4ddated by LMP consistent with an 8 week ultrasound.  Her pregnancy has been complicated by gestational diabetes, diet controlled, history of c-section after 4 hours 2nd stage, anxiety.    She denies contractions.   She denies leakage of fluid.   She denies vaginal bleeding.   She reports fetal movement.    Total weight gain for pregnancy: 33 lb (15 kg)   Obstetrical Problem List: Pregnancy#2 Problems (from 08/19/18 to present)    Problem Noted Resolved   Gestational diabetes mellitus (GDM) in third trimester 11/24/2018 by HGae Dry MD No   Overview Signed 11/24/2018  9:55 AM by HGae Dry MD    Current Diabetic Medications:  None  _0  Aspirin 81 mg daily after 12 weeks; discontinue after 36 weeks (? A2/B GDM)  Required Referrals for A1GDM or A2GDM:(as of 11/24/18) [ x] Diabetes Education and Testing Supplies [ x] Nutrition Cousult  For A2/B GDM or higher classes of DM _1  Diabetes Education and Testing Supplies _2  Nutrition Counsult _3  Fetal ECHO after 22-24 weeks  _4  Eye exam for retina evaluation  _5  Baseline EKG _6  UKoreafetal growth every 4 weeks starting at 28 weeks _7  Twice weekly NST starting at [redacted] weeks gestation _8  Delivery planning contingent on fetal growth, AFI, glycemic control, and other co-morbidities but at least by 39 weeks  [facility])  Lab Results  Component Value Date   CREATININE 0.62 09/30/2018   AST 18 09/30/2018   ALT 14 09/30/2018   TSH 0.816 09/30/2018   No results found for: HGBA1C  Antenatal Testing Class of DM U/S NST/AFI DELIVERY  Diabetes   A1 - good control - O24.410    A2 - good control - O24.419      A2  - poor control or poor compliance - O24.419, E11.65   (Macrosomia or  polyhydramnios) **E11.65 is extra code for poor control**    A2/B - O24.919  and B-C O24.319  Poor control B-C or D-R-F-T - O24.319  or  Type I DM - O24.019  20-38  20-38  20-24-28-32-36   20-24-28-32-35-38//fetal echo  20-24-27-30-33-36-38//fetal echo  40  32//2 x wk  32//2 x wk   32//2 x wk  28//BPP wkly then 32//2 x wk  40  39  PRN   39  PRN          History of cesarean delivery 10/18/2018 by HGae Dry MD No   Overview Signed 05/08/2019  7:18 PM by GDalia Heading CNM    Four hour second stage (FTP)      Supervision of high risk pregnancy, antepartum 10/11/2018 by GRod Can CNM No   Overview Addendum 03/16/2019 11:46 AM by SHomero Fellers MD    Clinic [facility] Prenatal Labs  Dating WS 8 weeks UKoreaBlood type: B/Positive/-- (03/16 0957)   Genetic Screen    NIPS: normal Antibody:Negative (03/16 0957)  Anatomic UKoreacomplete Rubella: 1.65 (03/16 0957) Varicella: Immune  GTT Early: 190; abnormal 3 hr GTT  RPR: Non Reactive (03/16 0957)   Rhogam B POS HBsAg: Negative (03/16 0957)   TDaP vaccine  8/19/2020Flu Shot: declined HIV: Non Reactive (03/16 0957)   Baby Food  Breast  GBS: negative on 10/1  Contraception  Pap: 3/16  CBB   GC/CT: neg/neg  CS/VBAC 2013- arrest of descent   Support Person Debra Wilson              Maternal Medical History:   Past Medical History:  Diagnosis Date  . GDM (gestational diabetes mellitus)     Past Surgical History:  Procedure Laterality Date  . CESAREAN SECTION  2013   Gestation Diabetes/INduction    No Known Allergies  Prior to Admission medications   Medication Sig Start Date End Date Taking? Authorizing Provider  acetaminophen (TYLENOL) 500 MG tablet Take 1,000 mg by mouth every 6 (six) hours as needed for headache.    [provider]  glucose blood (ACCU-CHEK GUIDE) test strip Use as instructed 12/31/18   Gae Dry, MD  Prenatal Vit-Fe  Fumarate-FA (PRENATAL MULTIVITAMIN) TABS tablet Take 1 tablet by mouth daily.     [provider]  sucralfate (CARAFATE) 1 g tablet Take 1 tablet (1 g total) by mouth 4 (four) times daily -  with meals and at bedtime. Patient taking differently: Take 1 g by mouth 3 (three) times daily as needed (acid reflux).  04/28/19   Schuman, Stefanie Libel, MD    OB History  Gravida Para Term Preterm AB Living  _0 SAB TAB Ectopic Multiple Live Births  1       1    # Outcome Date GA Lbr Len/2nd Weight Sex Delivery Anes PTL Lv  3 Current           2 Term 03/14/12 [redacted]w[redacted]d 7 lb (3.175 kg) F CS-LTranv  N LIV     Complications: Failure to Progress in Second Stage, GDM (gestational diabetes mellitus)  1 SAB             Prenatal care site: [facility]  Social History: She  reports that she has never smoked. She has never used smokeless tobacco. She reports previous alcohol use. She reports that she does not use drugs.  Family History: family history includes Diabetes in her mother; Multiple myeloma in her mother.   Review of Systems:  Review of Systems  Constitutional: Negative.   HENT: Negative.   Eyes: Negative.   Respiratory: Negative.   Cardiovascular: Negative.   Gastrointestinal: Negative.   Genitourinary: Negative.   Musculoskeletal: Negative.   Skin: Negative.   Neurological: Negative.   Psychiatric/Behavioral: Negative.      Physical Exam:  BP 118/76   Wt 150 lb (68 kg)   LMP 08/19/2018 (Approximate)   BMI 30.30 kg/m   Physical Exam Constitutional:      General: She is not in acute distress.    Appearance: Normal appearance. She is well-developed.  HENT:     Head: Normocephalic and atraumatic.  Eyes:     General: No scleral icterus.    Conjunctiva/sclera: Conjunctivae normal.  Neck:     Musculoskeletal: Normal range of motion and neck supple.  Cardiovascular:     Rate and Rhythm: Normal rate and regular rhythm.     Heart sounds: No murmur. No friction  rub. No gallop.   Pulmonary:     Effort: Pulmonary effort is normal. No respiratory distress.     Breath sounds: Normal breath sounds. No wheezing or rales.  Abdominal:     General: Bowel sounds are normal. There is no distension.     Palpations: Abdomen is soft.     Tenderness: There  is no abdominal tenderness. There is no guarding or rebound.     Comments: Gravid, nt  Musculoskeletal: Normal range of motion.  Neurological:     General: No focal deficit present.     Mental Status: She is alert and oriented to person, place, and time.     Cranial Nerves: No cranial nerve deficit.  Skin:    General: Skin is warm and dry.     Findings: No erythema.  Psychiatric:        Mood and Affect: Mood normal.        Behavior: Behavior normal.        Judgment: Judgment normal.    Baseline FHR: 135 beats/min   Variability: moderate   Accelerations: present   Decelerations: absent Contractions: not done Overall assessment: cat 1, reactive  Imaging Results US Ob Limited  Result Date: 05/23/2019 Patient Name: Debra Wilson DOB: 14-Jan-1986 MRN: [id number] ULTRASOUND REPORT Location: [facility] Date of Service: 05/23/2019 Indications:AFI Findings: Debra Wilson intrauterine pregnancy is visualized with FHR at 148 BPM. Fetal presentation is Cephalic. Placenta: anterior. Grade: 3 AFI: 14.6 cm Impression: 1. 41w4dViable Singleton Intrauterine pregnancy dated by previously established criteria. 2. AFI is 14.6 cm. EGweneth Wilson RT The ultrasound images and findings were reviewed by me and I agree with the above report. SPrentice Docker MD, [facility], [facility] 05/23/2019 9:23 AM       No results found for: SARSCOV2NAA]  Assessment:  Debra Baranowskiis a 33y.o. GG106P1011female at 324w4dith history of cesarean delivery.   Plan:  1. Admit to Labor & Delivery  2. CBC, T&S, Clrs, IVF 3. GBS negative.   4. Fetwal well-being: reassuring 5. She has been consented for  c-section. She would like to attempt a TOLAC, if she goes into spontaneous labor. She has previously been thoroughly counseled regarding the risks and benefits of this approach.    StPrentice DockerMD 05/23/2019 9:19 AM

## 2019-05-24 ENCOUNTER — Other Ambulatory Visit
Admission: RE | Admit: 2019-05-24 | Discharge: 2019-05-24 | Disposition: A | Discharge status: Home / Self Care | Payer: 59 | Source: Ambulatory Visit | Attending: Obstetrics and Gynecology | Admitting: Obstetrics and Gynecology

## 2019-05-24 DIAGNOSIS — Z01812 Encounter for preprocedural laboratory examination: Secondary | ICD-10-CM | POA: Insufficient documentation

## 2019-05-24 DIAGNOSIS — Z20828 Contact with and (suspected) exposure to other viral communicable diseases: Secondary | ICD-10-CM | POA: Insufficient documentation

## 2019-05-24 LAB — SARS CORONAVIRUS 2 (TAT 6-24 HRS): SARS Coronavirus 2: NEGATIVE

## 2019-05-26 ENCOUNTER — Inpatient Hospital Stay: Payer: 59 | Admitting: Anesthesiology

## 2019-05-26 ENCOUNTER — Encounter
Admission: RE | Disposition: A | Discharge status: Home / Self Care | Payer: Self-pay | Source: Home / Self Care | Attending: Obstetrics and Gynecology

## 2019-05-26 ENCOUNTER — Other Ambulatory Visit: Payer: Self-pay

## 2019-05-26 ENCOUNTER — Inpatient Hospital Stay
Admission: RE | Admit: 2019-05-26 | Discharge: 2019-05-28 | DRG: 787 | Disposition: A | Discharge status: Home / Self Care | Payer: 59 | Attending: Obstetrics and Gynecology | Admitting: Obstetrics and Gynecology

## 2019-05-26 DIAGNOSIS — Z3A39 39 weeks gestation of pregnancy: Secondary | ICD-10-CM

## 2019-05-26 DIAGNOSIS — O0990 Supervision of high risk pregnancy, unspecified, unspecified trimester: Secondary | ICD-10-CM

## 2019-05-26 DIAGNOSIS — O24419 Gestational diabetes mellitus in pregnancy, unspecified control: Secondary | ICD-10-CM | POA: Diagnosis present

## 2019-05-26 DIAGNOSIS — Z98891 History of uterine scar from previous surgery: Secondary | ICD-10-CM

## 2019-05-26 DIAGNOSIS — O34211 Maternal care for low transverse scar from previous cesarean delivery: Principal | ICD-10-CM | POA: Diagnosis present

## 2019-05-26 DIAGNOSIS — D62 Acute posthemorrhagic anemia: Secondary | ICD-10-CM | POA: Diagnosis not present

## 2019-05-26 DIAGNOSIS — O480 Post-term pregnancy: Secondary | ICD-10-CM | POA: Diagnosis not present

## 2019-05-26 DIAGNOSIS — O9081 Anemia of the puerperium: Secondary | ICD-10-CM | POA: Diagnosis not present

## 2019-05-26 DIAGNOSIS — O24410 Gestational diabetes mellitus in pregnancy, diet controlled: Secondary | ICD-10-CM

## 2019-05-26 DIAGNOSIS — Z7982 Long term (current) use of aspirin: Secondary | ICD-10-CM

## 2019-05-26 DIAGNOSIS — Z20828 Contact with and (suspected) exposure to other viral communicable diseases: Secondary | ICD-10-CM | POA: Diagnosis present

## 2019-05-26 DIAGNOSIS — O24420 Gestational diabetes mellitus in childbirth, diet controlled: Secondary | ICD-10-CM | POA: Diagnosis present

## 2019-05-26 DIAGNOSIS — Z3A40 40 weeks gestation of pregnancy: Secondary | ICD-10-CM | POA: Diagnosis not present

## 2019-05-26 DIAGNOSIS — O34219 Maternal care for unspecified type scar from previous cesarean delivery: Secondary | ICD-10-CM | POA: Diagnosis not present

## 2019-05-26 LAB — CBC
HCT: 32.6 % — ABNORMAL LOW (ref 36.0–46.0)
Hemoglobin: 11.4 g/dL — ABNORMAL LOW (ref 12.0–15.0)
MCH: 32.2 pg (ref 26.0–34.0)
MCHC: 35 g/dL (ref 30.0–36.0)
MCV: 92.1 fL (ref 80.0–100.0)
Platelets: 155 10*3/uL (ref 150–400)
RBC: 3.54 MIL/uL — ABNORMAL LOW (ref 3.87–5.11)
RDW: 12.4 % (ref 11.5–15.5)
WBC: 7.1 10*3/uL (ref 4.0–10.5)
nRBC: 0 % (ref 0.0–0.2)

## 2019-05-26 LAB — TYPE AND SCREEN
ABO/RH(D): B POS
Antibody Screen: NEGATIVE

## 2019-05-26 LAB — GLUCOSE, CAPILLARY
Glucose-Capillary: 80 mg/dL (ref 70–99)
Glucose-Capillary: 83 mg/dL (ref 70–99)
Glucose-Capillary: 115 mg/dL — ABNORMAL HIGH (ref 70–99)

## 2019-05-26 LAB — PROTEIN / CREATININE RATIO, URINE
Creatinine, Urine: 86 mg/dL
Protein Creatinine Ratio: 0.2 mg/mg{Cre} — ABNORMAL HIGH (ref 0.00–0.15)
Total Protein, Urine: 17 mg/dL

## 2019-05-26 LAB — RAPID HIV SCREEN (HIV 1/2 AB+AG)
HIV 1/2 Antibodies: NONREACTIVE
HIV-1 P24 Antigen - HIV24: NONREACTIVE

## 2019-05-26 LAB — RPR: RPR Ser Ql: NONREACTIVE

## 2019-05-26 LAB — ABO/RH: ABO/RH(D): B POS

## 2019-05-26 SURGERY — Surgical Case
Anesthesia: Spinal

## 2019-05-26 MED ORDER — IBUPROFEN 600 MG PO TABS
600.0000 mg | ORAL_TABLET | Freq: Four times a day (QID) | ORAL | Status: DC
  Administered 2019-05-27 – 2019-05-28 (×5): 600 mg via ORAL
  Filled 2019-05-26 (×5): qty 1

## 2019-05-26 MED ORDER — BUPIVACAINE HCL (PF) 0.5 % IJ SOLN
5.0000 mL | Freq: Once | INTRAMUSCULAR | Status: DC
  Filled 2019-05-26: qty 30

## 2019-05-26 MED ORDER — SCOPOLAMINE 1 MG/3DAYS TD PT72
1.0000 | MEDICATED_PATCH | Freq: Once | TRANSDERMAL | Status: DC
  Administered 2019-05-26: 1.5 mg via TRANSDERMAL
  Filled 2019-05-26: qty 1

## 2019-05-26 MED ORDER — SOD CITRATE-CITRIC ACID 500-334 MG/5ML PO SOLN
30.0000 mL | ORAL | Status: AC
  Administered 2019-05-26: 30 mL via ORAL
  Filled 2019-05-26: qty 30

## 2019-05-26 MED ORDER — DIPHENHYDRAMINE HCL 50 MG/ML IJ SOLN: 12.5000 mg | INTRAMUSCULAR | Status: DC | PRN

## 2019-05-26 MED ORDER — FENTANYL CITRATE (PF) 100 MCG/2ML IJ SOLN
INTRAMUSCULAR | Status: AC
  Filled 2019-05-26: qty 2

## 2019-05-26 MED ORDER — OXYTOCIN 40 UNITS IN NORMAL SALINE INFUSION - SIMPLE MED: INTRAVENOUS | Status: DC | PRN

## 2019-05-26 MED ORDER — WITCH HAZEL-GLYCERIN EX PADS: 1.0000 "application " | MEDICATED_PAD | CUTANEOUS | Status: DC | PRN

## 2019-05-26 MED ORDER — DIPHENHYDRAMINE HCL 25 MG PO CAPS: 25.0000 mg | ORAL_CAPSULE | ORAL | Status: DC | PRN

## 2019-05-26 MED ORDER — ACETAMINOPHEN 160 MG/5ML PO SOLN
325.0000 mg | ORAL | Status: DC | PRN
  Filled 2019-05-26: qty 20.3

## 2019-05-26 MED ORDER — NALBUPHINE HCL 10 MG/ML IJ SOLN: 5.0000 mg | Freq: Once | INTRAMUSCULAR | Status: DC | PRN

## 2019-05-26 MED ORDER — BUPIVACAINE HCL 0.5 % IJ SOLN
INTRAMUSCULAR | Status: DC | PRN
  Administered 2019-05-26: 10 mL

## 2019-05-26 MED ORDER — KETOROLAC TROMETHAMINE 30 MG/ML IJ SOLN
30.0000 mg | Freq: Four times a day (QID) | INTRAMUSCULAR | Status: DC
  Administered 2019-05-26 – 2019-05-27 (×3): 30 mg via INTRAMUSCULAR
  Filled 2019-05-26 (×3): qty 1

## 2019-05-26 MED ORDER — NALBUPHINE HCL 10 MG/ML IJ SOLN: 5.0000 mg | INTRAMUSCULAR | Status: DC | PRN

## 2019-05-26 MED ORDER — BUPIVACAINE IN DEXTROSE 0.75-8.25 % IT SOLN
INTRATHECAL | Status: DC | PRN
  Administered 2019-05-26: 1.5 mL via INTRATHECAL

## 2019-05-26 MED ORDER — PHENYLEPHRINE HCL-NACL 10-0.9 MG/250ML-% IV SOLN
INTRAVENOUS | Status: DC | PRN
  Administered 2019-05-26: 20 ug/min via INTRAVENOUS

## 2019-05-26 MED ORDER — NALOXONE HCL 0.4 MG/ML IJ SOLN: 0.4000 mg | INTRAMUSCULAR | Status: DC | PRN

## 2019-05-26 MED ORDER — MORPHINE SULFATE (PF) 0.5 MG/ML IJ SOLN
INTRAMUSCULAR | Status: DC | PRN
  Administered 2019-05-26: .15 mg via EPIDURAL

## 2019-05-26 MED ORDER — PHENYLEPHRINE 40 MCG/ML (10ML) SYRINGE FOR IV PUSH (FOR BLOOD PRESSURE SUPPORT)
PREFILLED_SYRINGE | INTRAVENOUS | Status: DC | PRN
  Administered 2019-05-26: 100 ug via INTRAVENOUS

## 2019-05-26 MED ORDER — MORPHINE SULFATE (PF) 0.5 MG/ML IJ SOLN
INTRAMUSCULAR | Status: AC
  Filled 2019-05-26: qty 10

## 2019-05-26 MED ORDER — SIMETHICONE 80 MG PO CHEW
80.0000 mg | CHEWABLE_TABLET | Freq: Three times a day (TID) | ORAL | Status: DC
  Administered 2019-05-26 – 2019-05-28 (×7): 80 mg via ORAL
  Filled 2019-05-26 (×7): qty 1

## 2019-05-26 MED ORDER — OXYTOCIN 40 UNITS IN NORMAL SALINE INFUSION - SIMPLE MED
INTRAVENOUS | Status: DC | PRN
  Administered 2019-05-26: 400 mL via INTRAVENOUS
  Administered 2019-05-26: 100 mL via INTRAVENOUS
  Administered 2019-05-26: 200 mL via INTRAVENOUS
  Administered 2019-05-26: 300 mL via INTRAVENOUS

## 2019-05-26 MED ORDER — LIDOCAINE HCL (PF) 1 % IJ SOLN
INTRAMUSCULAR | Status: DC | PRN
  Administered 2019-05-26: 2 mL via SUBCUTANEOUS

## 2019-05-26 MED ORDER — PROMETHAZINE HCL 25 MG/ML IJ SOLN: 6.2500 mg | INTRAMUSCULAR | Status: DC | PRN

## 2019-05-26 MED ORDER — MENTHOL 3 MG MT LOZG: 1.0000 | LOZENGE | OROMUCOSAL | Status: DC | PRN

## 2019-05-26 MED ORDER — PRENATAL MULTIVITAMIN CH
1.0000 | ORAL_TABLET | Freq: Every day | ORAL | Status: DC
  Administered 2019-05-26 – 2019-05-28 (×3): 1 via ORAL
  Filled 2019-05-26 (×3): qty 1

## 2019-05-26 MED ORDER — LACTATED RINGERS IV SOLN
INTRAVENOUS | Status: DC
  Administered 2019-05-26: 06:00:00 via INTRAVENOUS

## 2019-05-26 MED ORDER — FERROUS SULFATE 325 (65 FE) MG PO TABS
325.0000 mg | ORAL_TABLET | Freq: Two times a day (BID) | ORAL | Status: DC
  Administered 2019-05-26 – 2019-05-28 (×4): 325 mg via ORAL
  Filled 2019-05-26 (×4): qty 1

## 2019-05-26 MED ORDER — PROPOFOL 10 MG/ML IV BOLUS
INTRAVENOUS | Status: AC
  Filled 2019-05-26: qty 20

## 2019-05-26 MED ORDER — OXYCODONE-ACETAMINOPHEN 5-325 MG PO TABS: 2.0000 | ORAL_TABLET | ORAL | Status: DC | PRN

## 2019-05-26 MED ORDER — ACETAMINOPHEN 325 MG PO TABS
325.0000 mg | ORAL_TABLET | ORAL | Status: DC | PRN
  Administered 2019-05-26: 650 mg via ORAL
  Filled 2019-05-26: qty 2

## 2019-05-26 MED ORDER — COCONUT OIL OIL
1.0000 "application " | TOPICAL_OIL | Status: DC | PRN
  Administered 2019-05-26: 1 via TOPICAL
  Filled 2019-05-26: qty 120

## 2019-05-26 MED ORDER — NALOXONE HCL 4 MG/10ML IJ SOLN
1.0000 ug/kg/h | INTRAVENOUS | Status: DC | PRN
  Filled 2019-05-26: qty 5

## 2019-05-26 MED ORDER — DIPHENHYDRAMINE HCL 25 MG PO CAPS: 25.0000 mg | ORAL_CAPSULE | Freq: Four times a day (QID) | ORAL | Status: DC | PRN

## 2019-05-26 MED ORDER — OXYTOCIN 40 UNITS IN NORMAL SALINE INFUSION - SIMPLE MED
2.5000 [IU]/h | INTRAVENOUS | Status: DC
  Administered 2019-05-26: 2.5 [IU]/h via INTRAVENOUS
  Filled 2019-05-26: qty 1000

## 2019-05-26 MED ORDER — LACTATED RINGERS IV SOLN
INTRAVENOUS | Status: DC
  Administered 2019-05-26: via INTRAVENOUS

## 2019-05-26 MED ORDER — CEFAZOLIN SODIUM-DEXTROSE 2-4 GM/100ML-% IV SOLN
2.0000 g | INTRAVENOUS | Status: AC
  Administered 2019-05-26: 2 g via INTRAVENOUS
  Filled 2019-05-26: qty 100

## 2019-05-26 MED ORDER — KETOROLAC TROMETHAMINE 30 MG/ML IJ SOLN
30.0000 mg | Freq: Once | INTRAMUSCULAR | Status: AC | PRN
  Administered 2019-05-26: 30 mg via INTRAVENOUS
  Filled 2019-05-26: qty 1

## 2019-05-26 MED ORDER — ONDANSETRON HCL 4 MG/2ML IJ SOLN: 4.0000 mg | Freq: Three times a day (TID) | INTRAMUSCULAR | Status: DC | PRN

## 2019-05-26 MED ORDER — OXYTOCIN 40 UNITS IN NORMAL SALINE INFUSION - SIMPLE MED
INTRAVENOUS | Status: AC
  Filled 2019-05-26: qty 1000

## 2019-05-26 MED ORDER — BUPIVACAINE 0.25 % ON-Q PUMP DUAL CATH 400 ML
400.0000 mL | INJECTION | Status: DC
  Filled 2019-05-26: qty 400

## 2019-05-26 MED ORDER — DIBUCAINE (PERIANAL) 1 % EX OINT: 1.0000 "application " | TOPICAL_OINTMENT | CUTANEOUS | Status: DC | PRN

## 2019-05-26 MED ORDER — SODIUM CHLORIDE 0.9% FLUSH: 3.0000 mL | INTRAVENOUS | Status: DC | PRN

## 2019-05-26 MED ORDER — OXYCODONE-ACETAMINOPHEN 5-325 MG PO TABS: 1.0000 | ORAL_TABLET | ORAL | Status: DC | PRN

## 2019-05-26 MED ORDER — FENTANYL CITRATE (PF) 100 MCG/2ML IJ SOLN
INTRAMUSCULAR | Status: DC | PRN
  Administered 2019-05-26 (×2): 50 ug via INTRAVENOUS

## 2019-05-26 MED ORDER — SENNOSIDES-DOCUSATE SODIUM 8.6-50 MG PO TABS
2.0000 | ORAL_TABLET | ORAL | Status: DC
  Administered 2019-05-26: 2 via ORAL
  Filled 2019-05-26 (×2): qty 2

## 2019-05-26 SURGICAL SUPPLY — 35 items
CANISTER SUCT 3000ML PPV (MISCELLANEOUS) ×3 IMPLANT
CATH KIT ON-Q SILVERSOAK 5 (CATHETERS) ×2 IMPLANT
CATH KIT ON-Q SILVERSOAK 5IN (CATHETERS) ×6 IMPLANT
CLOSURE WOUND 1/2 X4 (GAUZE/BANDAGES/DRESSINGS) ×1
COVER WAND RF STERILE (DRAPES) ×1 IMPLANT
DERMABOND ADVANCED (GAUZE/BANDAGES/DRESSINGS) ×4
DERMABOND ADVANCED .7 DNX12 (GAUZE/BANDAGES/DRESSINGS) ×1 IMPLANT
DRSG OPSITE POSTOP 4X10 (GAUZE/BANDAGES/DRESSINGS) ×3 IMPLANT
DRSG TELFA 3X8 NADH (GAUZE/BANDAGES/DRESSINGS) ×3 IMPLANT
ELECT CAUTERY BLADE 6.4 (BLADE) ×3 IMPLANT
ELECT REM PT RETURN 9FT ADLT (ELECTROSURGICAL) ×3
ELECTRODE REM PT RTRN 9FT ADLT (ELECTROSURGICAL) ×1 IMPLANT
GAUZE SPONGE 4X4 12PLY STRL (GAUZE/BANDAGES/DRESSINGS) ×3 IMPLANT
GLOVE BIO SURGEON STRL SZ7 (GLOVE) ×5 IMPLANT
GLOVE INDICATOR 7.5 STRL GRN (GLOVE) ×3 IMPLANT
GOWN STRL REUS W/ TWL LRG LVL3 (GOWN DISPOSABLE) ×3 IMPLANT
GOWN STRL REUS W/TWL LRG LVL3 (GOWN DISPOSABLE) ×6
NS IRRIG 1000ML POUR BTL (IV SOLUTION) ×3 IMPLANT
PACK C SECTION AR (MISCELLANEOUS) ×3 IMPLANT
PAD DRESSING TELFA 3X8 NADH (GAUZE/BANDAGES/DRESSINGS) ×1 IMPLANT
PAD OB MATERNITY 4.3X12.25 (PERSONAL CARE ITEMS) ×6 IMPLANT
PAD PREP 24X41 OB/GYN DISP (PERSONAL CARE ITEMS) ×3 IMPLANT
PENCIL SMOKE ULTRAEVAC 22 CON (MISCELLANEOUS) ×3 IMPLANT
STRIP CLOSURE SKIN 1/2X4 (GAUZE/BANDAGES/DRESSINGS) ×2 IMPLANT
SUT MNCRL 4-0 (SUTURE) ×2
SUT MNCRL 4-0 27XMFL (SUTURE) ×1
SUT PDS AB 1 TP1 96 (SUTURE) ×3 IMPLANT
SUT PLAIN GUT 0 (SUTURE) ×2 IMPLANT
SUT VIC AB 0 CTX 36 (SUTURE) ×4
SUT VIC AB 0 CTX36XBRD ANBCTRL (SUTURE) ×2 IMPLANT
SUT VIC AB 3-0 SH 27 (SUTURE) ×2
SUT VIC AB 3-0 SH 27X BRD (SUTURE) IMPLANT
SUT VICRYL 3-0 27IN SH (SUTURE) ×2 IMPLANT
SUTURE MNCRL 4-0 27XMF (SUTURE) ×1 IMPLANT
SWABSTK COMLB BENZOIN TINCTURE (MISCELLANEOUS) ×3 IMPLANT

## 2019-05-26 NOTE — Discharge Summary (Signed)
OB Discharge Summary     Patient Name: Debra Wilson DOB: 11-01-1985 MRN: [id number]  Date of admission: 05/26/2019 Delivering MD: Thomasene Mohair, MD  Date of Delivery: 05/26/2019  Date of discharge: 05/28/2019  Admitting diagnosis: Repeat Cesarean, desires repeat Intrauterine pregnancy: [redacted]w[redacted]d     Secondary diagnosis: Gestational Diabetes diet controlled (A1)     Discharge diagnosis: Term Pregnancy Delivered and GDM A1                                                                                                Post partum procedures:none  Augmentation: n/a  Complications: None  Hospital course:  Sceduled C/S   33 y.o. yo [id number] at [redacted]w[redacted]d was admitted to the hospital 05/26/2019 for scheduled cesarean section with the following indication:Elective Repeat.  Membrane Rupture Time/Date: 8:21 AM ,05/26/2019   Patient delivered a Viable infant.05/26/2019  Details of operation can be found in separate operative note.  Pateint had an uncomplicated postpartum course.  She is ambulating, tolerating a regular diet, passing flatus, and urinating well. Patient is discharged home in stable condition on  05/28/19.  She has blood glucose checks postpartum which were normal.         Physical exam  Vitals:   05/27/19 0801 05/27/19 1613 05/28/19 0000 05/28/19 0917  BP: 106/73 114/73 130/80 127/85  Pulse: 79 76 79 79  Resp: 18 18 18 18   Temp: 98.2 F (36.8 C) 98.3 F (36.8 C) 98.1 F (36.7 C) 98.2 F (36.8 C)  TempSrc: Oral Axillary Oral Oral  SpO2: 98%  100% 99%  Weight:      Height:       General: alert, cooperative and no distress Lochia: appropriate Uterine Fundus: firm Incision: Healing well with no significant drainage, No significant erythema, Dressing is clean, dry, and intact DVT Evaluation: No evidence of DVT seen on physical exam.  Labs: Lab Results  Component Value Date   WBC 8.3 05/27/2019   HGB 9.5 (L) 05/27/2019   HCT 27.6 (L) 05/27/2019   MCV 93.2 05/27/2019   PLT 133 (L) 05/27/2019    Discharge instruction: per After Visit Summary.  Medications:  Allergies as of 05/28/2019   No Known Allergies     Medication List    TAKE these medications   acetaminophen 500 MG tablet Commonly known as: TYLENOL Take 2 tablets (1,000 mg total) by mouth every 6 (six) hours as needed for moderate pain. What changed: reasons to take this   docusate sodium 100 MG capsule Commonly known as: Colace Take 1 capsule (100 mg total) by mouth 2 (two) times daily.   glucose blood test strip Commonly known as: Accu-Chek Guide Use as instructed   ibuprofen 600 MG tablet Commonly known as: ADVIL Take 1 tablet (600 mg total) by mouth every 6 (six) hours as needed.   prenatal multivitamin Tabs tablet Take 1 tablet by mouth daily.   sucralfate 1 g tablet Commonly known as: Carafate Take 1 tablet (1 g total) by mouth 4 (four) times daily -  with meals and at bedtime. What changed:   when to take this  reasons to take this   traMADol 50 MG tablet Commonly known as: Ultram Take 1 tablet (50 mg total) by mouth every 6 (six) hours as needed for up to 7 days.            Discharge Care Instructions  (From admission, onward)         Start     Ordered   05/28/19 0000  Discharge wound care:    Comments: SHOWER DAILY Wash incision gently with soap and water.  Call office with any drainage, redness, or firmness of the incision.   05/28/19 1003          Diet: routine diet  Activity: Advance as tolerated. Pelvic rest for 6 weeks.   Outpatient follow up: Follow-up Information    Will Bonnet, MD. Go on 06/06/2019.   Specialty: Obstetrics and Gynecology Why: Keep follow up appointment for post-op incision check Contact information: [address] [place] [address] [phone]             Postpartum contraception: Nexplanon Rhogam Given postpartum: no Rubella vaccine given postpartum: no Varicella vaccine given  postpartum: no TDaP given antepartum or postpartum: 03/16/2019 Influenza Vaccine: 05/06/2019  Newborn Data: Live born female  Birth Weight: 8 lb 11 oz (3,940 g) APGAR: 9, 9  Newborn Delivery   Birth date/time: 05/26/2019 08:23:00 Delivery type: C-Section, Low Transverse Trial of labor: No C-section categorization: Repeat      Baby Feeding: Breast  Disposition:home with mother  SIGNED:   Adrian Prows MD [place] 05/28/2019 10:04 AM

## 2019-05-26 NOTE — Interval H&P Note (Signed)
History and Physical Interval Note:  05/26/2019 7:24 AM  Debra Wilson  has presented today for surgery, with the diagnosis of Repeat Cesarean.  The various methods of treatment have been discussed with the patient and family. After consideration of risks, benefits and other options for treatment, the patient has consented to  Procedure(s): CESAREAN SECTION (N/A) as a surgical intervention.  The patient's history has been reviewed, patient examined, no change in status, stable for surgery.  I have reviewed the patient's chart and labs.  Questions were answered to the patient's satisfaction.   She notes +FM, no LOF, some mild vaginal spotting yesterday. Denies contractions. She would like to proceed with c-section.  Prentice Docker, MD, [facility], [facility] 05/26/2019 7:24 AM

## 2019-05-26 NOTE — Op Note (Signed)
Cesarean Section Operative Note    Debra Wilson   05/26/2019   Pre-operative Diagnosis:  1) History of cesarean delivery, desires repeat 2) intrauterine pregnancy at [redacted]w[redacted]d   Post-operative Diagnosis:  1) History of cesarean delivery, desires repeat 2) intrauterine pregnancy at [redacted]w[redacted]d    Procedure: Repeat Low Transverse Cesarean Section via Pfannenstiel incision with double-layer uterine closure  Surgeon: Surgeon(s) and Role:    Will Bonnet, MD - Primary   Assistants: Dr. Humberto Leep; No other capable assistant available, in surgery requiring high level assistant.  Anesthesia: spinal   Findings:  1) normal appearing gravid uterus, fallopian tubes, and ovaries 2) viable female infant with APGARs 9 and 9, weight 3,630 grams   Estimated Blood Loss: 600 mL  Total IV Fluids: 1,600 ml   Urine Output: 20 mL clear urine at end of procedure  Specimens: none  Complications: no complications  Disposition: PACU - hemodynamically stable.   Maternal Condition: stable   Baby condition / location:  Couplet care / Skin to Skin  Procedure Details:  The patient was seen in the Holding Room. The risks, benefits, complications, treatment options, and expected outcomes were discussed with the patient. The patient concurred with the proposed plan, giving informed consent. identified as Debra Wilson and the procedure verified as C-Section Delivery. A Time Out was held and the above information confirmed.   After induction of anesthesia, the patient was draped and prepped in the usual sterile manner. A Pfannenstiel incision was made and carried down through the subcutaneous tissue to the fascia. Fascial incision was made and extended transversely. The fascia was separated from the underlying rectus tissue superiorly and inferiorly. The peritoneum was identified and entered. Peritoneal incision was extended longitudinally. The bladder flap was bluntly and sharply freed from the lower  uterine segment. A low transverse uterine incision was made and the hysterotomy was extended with cranial-caudal tension. Delivered from cephalic presentation was a 3,630 gram Living newborn infant(s) or Female with Apgar scores of 9 at one minute and 9 at five minutes. Cord ph was not sent the umbilical cord was clamped and cut cord blood was not obtained for evaluation. The placenta was removed Intact and appeared normal. The uterine outline, tubes and ovaries appeared normal. The uterine incision was closed with running locked sutures of 0 Vicryl.  A second layer of the same suture was thrown in an imbricating fashion.  Hemostasis was assured.  The uterus was returned to the abdomen and the paracolic gutters were cleared of all clots and debris.  The rectus muscles were inspected and found to be hemostatic.  The On-Q catheter pumps were inserted in accordance with the manufacturer's recommendations.  The catheters were inserted approximately 4cm cephelad to the incision line, approximately 1cm apart, straddling the midline.  They were inserted to a depth of the 4th mark. They were positioned superficial to the rectus abdominus muscles and deep to the rectus fascia.    The fascia was then reapproximated with running sutures of 1-0 PDS, looped. The subcutaneous tissue was re-approximated using 3-0 Vicryl to reduce tension on the skin closure. The subcuticular closure was performed using 4-0 monocryl. The skin closure was reinforced using surgical skin glue.  The On-Q catheters were bolused with 5 mL of 0.5% marcaine plain for a total of 10 mL.  The catheters were affixed to the skin with surgical skin glue, steri-strips, and tegaderm.    Instrument, sponge, and needle counts were correct prior the abdominal closure and  were correct at the conclusion of the case.  The patient received Ancef 2 gram IV prior to skin incision (within 30 minutes). For VTE prophylaxis she was wearing SCDs throughout the case.  The  assistant surgeon was an MD due to lack of availability of another Sales promotion account executive.   Signed: Conard Novak, MD 05/26/2019 9:24 AM

## 2019-05-26 NOTE — Lactation Note (Signed)
This note was copied from a baby's chart. Lactation Consultation Note  Patient Name: Debra Wilson [id number] Date: 05/26/2019 Reason for consult: Initial assessment  LC in to see mom and baby Debra Wilson.  Debra Wilson is 82 hours old, delivered via c-section. Transition RN reports feedings in recovery to have gone well, [name] self latching, no pain/discomfort. Mom questions feeding frequency due to [name] not appearing interested when offering at this time. LC and [facility] interns reviewed with mom breastfeeding basics, newborn stomach size, feeding frequency, feeding behaviors in the first 24 hours, and wet/poo expectations. Encouraged frequent skin to skin with baby to help identify early hunger cues, and promote healthy beginnings of breast milk production. [employer] name and number written on whiteboard, encouraged mom to call out when she feels Debra Wilson is starting to cue for assistance and/or guidance and reassurance.   Maternal Data Formula Feeding for Exclusion: No Has patient been taught Hand Expression?: Yes Does the patient have breastfeeding experience prior to this delivery?: Yes  Feeding Feeding Type: Breast Fed  LATCH Score Latch: Grasps breast easily, tongue down, lips flanged, rhythmical sucking.  Audible Swallowing: Spontaneous and intermittent  Type of Nipple: Everted at rest and after stimulation  Comfort (Breast/Nipple): Soft / non-tender  Hold (Positioning): No assistance needed to correctly position infant at breast.  LATCH Score: 10  Interventions Interventions: Breast feeding basics reviewed;Hand express  Lactation Tools Discussed/Used     Consult Status Consult Status: Follow-up Date: 05/26/19 Follow-up type: In-patient    Lavonia Drafts 05/26/2019, 1:53 PM

## 2019-05-26 NOTE — Anesthesia Post-op Follow-up Note (Signed)
Anesthesia QCDR form completed.        

## 2019-05-26 NOTE — Anesthesia Procedure Notes (Signed)
Spinal  Patient location during procedure: OR Start time: 05/26/2019 7:51 AM End time: 05/26/2019 7:56 AM Staffing Anesthesiologist: Alphonsus Sias, MD Resident/CRNA: Debe Coder, CRNA Performed: resident/CRNA  Preanesthetic Checklist Completed: patient identified, surgical consent, pre-op evaluation, timeout performed, IV checked, risks and benefits discussed and monitors and equipment checked Spinal Block Patient position: sitting Prep: ChloraPrep Patient monitoring: heart rate, blood pressure, continuous pulse ox and cardiac monitor Approach: midline Location: L3-4 Injection technique: single-shot Needle Needle type: Pencan  Needle gauge: 24 G Needle length: 10 cm Assessment Sensory level: T6

## 2019-05-26 NOTE — Progress Notes (Signed)
QBL 650

## 2019-05-26 NOTE — Anesthesia Preprocedure Evaluation (Addendum)
Anesthesia Evaluation  Patient identified by MRN, date of birth, ID band Patient awake    Reviewed: Allergy & Precautions, H&P , NPO status , reviewed documented beta blocker date and time   Airway Mallampati: II  TM Distance: >3 FB Neck ROM: full    Dental  (+) Teeth Intact   Pulmonary    Pulmonary exam normal        Cardiovascular Normal cardiovascular exam     Neuro/Psych    GI/Hepatic GERD  Medicated,  Endo/Other  diabetes, Gestational  Renal/GU      Musculoskeletal   Abdominal   Peds  Hematology   Anesthesia Other Findings Past Medical History: No date: GDM (gestational diabetes mellitus) No date: GERD (gastroesophageal reflux disease)  Past Surgical History: 2013: CESAREAN SECTION     Comment:  Gestation Diabetes/INduction  BMI    Body Mass Index: 30.30 kg/m      Reproductive/Obstetrics                            Anesthesia Physical Anesthesia Plan  ASA: II  Anesthesia Plan: Spinal   Post-op Pain Management:    Induction: Intravenous  PONV Risk Score and Plan: Treatment may vary due to age or medical condition and TIVA  Airway Management Planned: Nasal Cannula and Natural Airway  Additional Equipment:   Intra-op Plan:   Post-operative Plan:   Informed Consent: I have reviewed the patients History and Physical, chart, labs and discussed the procedure including the risks, benefits and alternatives for the proposed anesthesia with the patient or authorized representative who has indicated his/her understanding and acceptance.     Dental Advisory Given  Plan Discussed with: CRNA  Anesthesia Plan Comments:         Anesthesia Quick Evaluation

## 2019-05-26 NOTE — Lactation Note (Signed)
This note was copied from a baby's chart. Lactation Consultation Note  Patient Name: Debra Wilson [id number] Date: 05/26/2019 Reason for consult: Initial assessment  [facility] intern visited mom again to answer any breastfeeding questions.  Baby was asleep in bassinet, no cues, open hands.  Mom was reminded of baby's feeding cues and watching baby's hands for satiety.  [facility] intern informed mom of baby's ideal input and output in the first 24hours, the importance of skin to skin, self-care and sleep, and clusterfeeding at 24hours of age.  Mom was told how milk production works in a supply and demand model.  In order to safeguard her supply mom was told to offer the breast often or practice hand expression.  This was her primary concern since she felt like her "milk came in too late with her first."  She only breastfed her first for 2 days, so support is vital!   Mom may need encouragement to feed on cue and not by the clock as well.  Maternal Data Formula Feeding for Exclusion: No Has patient been taught Hand Expression?: Yes Does the patient have breastfeeding experience prior to this delivery?: Yes  Feeding Feeding Type: Breast Fed   Interventions Interventions: Breast feeding basics reviewed;Hand express  Lactation Tools Discussed/Used     Consult Status Consult Status: Follow-up Date: 05/26/19 Follow-up type: In-patient    Lavonia Drafts 05/26/2019, 4:32 PM

## 2019-05-26 NOTE — Transfer of Care (Signed)
Immediate Anesthesia Transfer of Care Note  Patient: Debra Wilson  Procedure(s) Performed: CESAREAN SECTION (N/A )  Patient Location: PACU and Mother/Baby  Anesthesia Type:Spinal  Level of Consciousness: awake, alert  and oriented  Airway & Oxygen Therapy: Patient Spontanous Breathing  Post-op Assessment: Report given to RN and Post -op Vital signs reviewed and stable  Post vital signs: Reviewed and stable  Last Vitals:  Vitals Value Taken Time  BP    Temp    Pulse    Resp    SpO2      Last Pain:  Vitals:   05/26/19 0707  TempSrc: Oral  PainSc: 0-No pain      Patients Stated Pain Goal: 0 (28/31/51 7616)  Complications: No apparent anesthesia complications

## 2019-05-27 ENCOUNTER — Encounter: Payer: Self-pay | Admitting: Obstetrics and Gynecology

## 2019-05-27 LAB — CBC
HCT: 27.6 % — ABNORMAL LOW (ref 36.0–46.0)
Hemoglobin: 9.5 g/dL — ABNORMAL LOW (ref 12.0–15.0)
MCH: 32.1 pg (ref 26.0–34.0)
MCHC: 34.4 g/dL (ref 30.0–36.0)
MCV: 93.2 fL (ref 80.0–100.0)
Platelets: 133 10*3/uL — ABNORMAL LOW (ref 150–400)
RBC: 2.96 MIL/uL — ABNORMAL LOW (ref 3.87–5.11)
RDW: 12.6 % (ref 11.5–15.5)
WBC: 8.3 10*3/uL (ref 4.0–10.5)
nRBC: 0 % (ref 0.0–0.2)

## 2019-05-27 NOTE — Lactation Note (Signed)
This note was copied from a baby's chart. Lactation Consultation Note  Patient Name: Debra Wilson AXENM'M Date: 05/27/2019 Reason for consult: Follow-up assessment Mom Bought a ? Lansinoh breast pump, encouraged her to contact her insurance about pump  Maternal Data Formula Feeding for Exclusion: No Has patient been taught Hand Expression?: Yes Does the patient have breastfeeding experience prior to this delivery?: Yes Attempted breastfeeding with first 2 days, child is now 27 yrs old Feeding Feeding Type: Breast Fed BAby latches easily, swallows heard, needs occ stimulation to continue sucking LATCH Score Latch: Grasps breast easily, tongue down, lips flanged, rhythmical sucking.  Audible Swallowing: Spontaneous and intermittent  Type of Nipple: Everted at rest and after stimulation  Comfort (Breast/Nipple): Soft / non-tender  Hold (Positioning): Assistance needed to correctly position infant at breast and maintain latch.  LATCH Score: 9  Interventions Interventions: Skin to skin;Hand express;Adjust position;Support pillows  Lactation Tools Discussed/Used WIC Program: No   Consult Status Consult Status: PRN Date: 05/27/19 Follow-up type: In-patient    Debra Wilson 05/27/2019, 5:12 PM

## 2019-05-27 NOTE — Anesthesia Postprocedure Evaluation (Signed)
Anesthesia Post Note  Patient: Debra Wilson  Procedure(s) Performed: CESAREAN SECTION (N/A )  Patient location during evaluation: Mother Baby Anesthesia Type: Spinal Level of consciousness: awake and alert Pain management: pain level controlled Vital Signs Assessment: post-procedure vital signs reviewed and stable Respiratory status: spontaneous breathing, nonlabored ventilation and respiratory function stable Cardiovascular status: stable Postop Assessment: no headache and no backache Anesthetic complications: no     Last Vitals:  Vitals:   05/27/19 0500 05/27/19 0504  BP:  123/84  Pulse: 83 82  Resp:    Temp:  36.8 C  SpO2: 100% 99%    Last Pain:  Vitals:   05/27/19 0504  TempSrc: Oral  PainSc:                  Alphonsus Sias

## 2019-05-27 NOTE — Discharge Instructions (Signed)
Discharge Instructions:   Follow-up Appointment:   If there are any new medications, they have been ordered and will be available for pickup at the listed pharmacy on your way home from the hospital.   Call office if you have any of the following: headache, visual changes, fever >101.0 F, chills, shortness of breath, breast concerns, excessive vaginal bleeding, incision drainage or problems, leg pain or redness, depression or any other concerns. If you have vaginal discharge with an odor, let your doctor know.   It is normal to bleed for up to 6 weeks. You should not soak through more than 1 pad in 1 hour. If you have a blood clot larger than your fist with continued bleeding, call your doctor.   After a c-section, you should expect a small amount of blood or clear fluid coming from the incision and abdominal cramping/soreness. Inspect your incision site daily. Stand in front of a mirror to look for any redness, incision opening, or discolored/odorness drainage. Take a shower daily and continue good hygiene. Use own towel and washcloth (do not share). Make sure your sheets on your bed are clean. No pets sleeping around your incision site. Dressing will be removed at your postpartum visit. If the dressing does become wet or soiled underneath, it is okay to remove it.   On-Q pump: You will remove on day 5 after insertion or if the ball becomes flat before day 5. You will remove on: November 3rd  Activity: Do not lift > 10 lbs for 6 weeks (do not lift anything heavier than your baby). No intercourse, tampons, swimming pools, hot tubs, baths (only showers) for 6 weeks.  No driving for 1-2 weeks. Continue prenatal vitamin, especially if breastfeeding. Increase calories and fluids (water) while breastfeeding.   Your milk will come in, in the next couple of days (right now it is colostrum). You may have a slight fever when your milk comes in, but it should go away on its own.  If it does not, and rises  above 101 F please call the doctor. You will also feel achy and your breasts will be firm. They will also start to leak. If you are breastfeeding, continue as you have been and you can pump/express milk for comfort.   If you have too much milk, your breasts can become engorged, which could lead to mastitis. This is an infection of the milk ducts. It can be very painful and you will need to notify your doctor to obtain a prescription for antibiotics. You can also treat it with a shower or hot/cold compress.   For concerns about your baby, please call your pediatrician.  For breastfeeding concerns, the lactation consultant can be reached at [phone].   Postpartum blues (feelings of happy one minute and sad another minute) are normal for the first few weeks but if it gets worse let your doctor know.   Congratulations! We enjoyed caring for you and your new bundle of joy!

## 2019-05-27 NOTE — Progress Notes (Addendum)
POD#1 rLTCS Subjective:  Mild fatigue, otherwise feels well. Pain is well-controlled. Voiding and ambulating with no problems. Tolerating a regular diet.  Objective:  Blood pressure 106/73, pulse 79, temperature 98.2 F (36.8 C), temperature source Oral, resp. rate 18, height 4\' 11"  (1.499 m), weight 68 kg, last menstrual period 08/19/2018, SpO2 98 %, currently breastfeeding.  General: NAD Pulmonary: no increased work of breathing Abdomen: non-distended, non-tender, fundus firm at level of umbilicus, lochia appropriate Incision: Dressing is C/D/I and On-Q in place Extremities: no edema, no erythema, no tenderness  Results for orders placed or performed during the hospital encounter of 05/26/19 (from the past 72 hour(s))  ABO/Rh     Status: None   Collection Time: 05/26/19  6:13 AM  Result Value Ref Range   ABO/RH(D)      B POS Performed at [facility], [address] [place], [place]   CBC     Status: Abnormal   Collection Time: 05/26/19  6:13 AM  Result Value Ref Range   WBC 7.1 4.0 - 10.5 K/uL   RBC 3.54 (L) 3.87 - 5.11 MIL/uL   Hemoglobin 11.4 (L) 12.0 - 15.0 g/dL   HCT 05/28/19 (L) 56.2 - 56.3 %   MCV 92.1 80.0 - 100.0 fL   MCH 32.2 26.0 - 34.0 pg   MCHC 35.0 30.0 - 36.0 g/dL   RDW 89.3 73.4 - 28.7 %   Platelets 155 150 - 400 K/uL   nRBC 0.0 0.0 - 0.2 %    Comment: Performed at [facility], [address] [place], [place]  RPR     Status: None   Collection Time: 05/26/19  6:13 AM  Result Value Ref Range   RPR Ser Ql NON REACTIVE NON REACTIVE    Comment: Performed at [facility], [address], [place], [place]  Rapid HIV screen (HIV 1/2 Ab+Ag)     Status: None   Collection Time: 05/26/19  6:13 AM  Result Value Ref Range   HIV-1 P24 Antigen - HIV24 NON REACTIVE NON REACTIVE    Comment: (NOTE) Detection of p24 may be inhibited by biotin in the sample, causing false negative results in acute infection.    HIV  1/2 Antibodies NON REACTIVE NON REACTIVE   Interpretation (HIV Ag Ab)      A non reactive test result means that HIV 1 or HIV 2 antibodies and HIV 1 p24 antigen were not detected in the specimen.    Comment: Performed at [facility], [address] [place], [place]  Type and screen [facility]     Status: None   Collection Time: 05/26/19  6:48 AM  Result Value Ref Range   ABO/RH(D) B POS    Antibody Screen NEG    Sample Expiration      05/29/2019,2359 Performed at [facility], [address] [place], [place]   Glucose, capillary     Status: None   Collection Time: 05/26/19  7:04 AM  Result Value Ref Range   Glucose-Capillary 80 70 - 99 mg/dL  Protein / creatinine ratio, urine     Status: Abnormal   Collection Time: 05/26/19  7:26 AM  Result Value Ref Range   Creatinine, Urine 86 mg/dL   Total Protein, Urine 17 mg/dL    Comment: NO NORMAL RANGE ESTABLISHED FOR THIS TEST   Protein Creatinine Ratio 0.20 (H) 0.00 - 0.15 mg/mg[Cre]    Comment:  Performed at [facility], [address] [place], [place] [address]  Glucose, capillary     Status: None   Collection Time: 05/26/19  9:55 AM  Result Value Ref Range   Glucose-Capillary 83 70 - 99 mg/dL  Glucose, capillary     Status: Abnormal   Collection Time: 05/26/19  4:51 PM  Result Value Ref Range   Glucose-Capillary 115 (H) 70 - 99 mg/dL   Comment 1 Document in Chart   CBC     Status: Abnormal   Collection Time: 05/27/19  4:48 AM  Result Value Ref Range   WBC 8.3 4.0 - 10.5 K/uL   RBC 2.96 (L) 3.87 - 5.11 MIL/uL   Hemoglobin 9.5 (L) 12.0 - 15.0 g/dL   HCT 27.6 (L) 36.0 - 46.0 %   MCV 93.2 80.0 - 100.0 fL   MCH 32.1 26.0 - 34.0 pg   MCHC 34.4 30.0 - 36.0 g/dL   RDW 12.6 11.5 - 15.5 %   Platelets 133 (L) 150 - 400 K/uL   nRBC 0.0 0.0 - 0.2 %    Comment: Performed at [facility], [address] [place], [place] [address]     Assessment:    33 y.o. [phone] post-operative day #1 recovering well  Plan:  1) Acute blood loss anemia - hemodynamically stable and asymptomatic - PO ferrous sulfate  2) Blood Type --/--/B POS (10/29 0354) / Rubella 1.65 (03/16 0957) / Varicella Immune  3) TDAP status: received antepartum on 03/16/2019  4) Breastfeeding/Contraception: not discussed  5) Disposition: continue post-operative care.  Avel Sensor, CNM 05/27/2019  9:28 AM

## 2019-05-28 LAB — GLUCOSE, CAPILLARY
Glucose-Capillary: 83 mg/dL (ref 70–99)
Glucose-Capillary: 78 mg/dL (ref 70–99)

## 2019-05-28 MED ORDER — IBUPROFEN 600 MG PO TABS: 600.0000 mg | ORAL_TABLET | Freq: Four times a day (QID) | ORAL | 3 refills | Status: DC | PRN

## 2019-05-28 MED ORDER — TRAMADOL HCL 50 MG PO TABS: 50.0000 mg | ORAL_TABLET | Freq: Four times a day (QID) | ORAL | 0 refills | Status: AC | PRN

## 2019-05-28 MED ORDER — DOCUSATE SODIUM 100 MG PO CAPS: 100.0000 mg | ORAL_CAPSULE | Freq: Two times a day (BID) | ORAL | 2 refills | Status: DC

## 2019-05-28 MED ORDER — ACETAMINOPHEN 500 MG PO TABS: 1000.0000 mg | ORAL_TABLET | Freq: Four times a day (QID) | ORAL | 1 refills | Status: DC | PRN

## 2019-05-28 NOTE — Progress Notes (Signed)
Patient discharged home with infant. Discharge instructions and prescriptions given and reviewed with patient. Incision cleaning kit provided and educated. Patient verbalized understanding. Escorted out by staff.

## 2019-05-28 NOTE — Progress Notes (Signed)
Admit Date: 05/26/2019 Today's Date: 05/28/2019  Post Partum Day 2  Subjective:  no complaints, up ad lib, voiding, tolerating PO, + flatus and has had a bowel movement  Objective: Temp:  [98.1 F (36.7 C)-98.3 F (36.8 C)] 98.2 F (36.8 C) (10/31 0917) Pulse Rate:  [76-79] 79 (10/31 0917) Resp:  [18] 18 (10/31 0917) BP: (114-130)/(73-85) 127/85 (10/31 0917) SpO2:  [99 %-100 %] 99 % (10/31 0917)  Physical Exam:  General: alert, cooperative and appears stated age 33: appropriate Uterine Fundus: firm Incision: healing well, no significant drainage, no dehiscence, no significant erythema, none DVT Evaluation: No evidence of DVT seen on physical exam.  Recent Labs    05/26/19 0613 05/27/19 0448  HGB 11.4* 9.5*  HCT 32.6* 27.6*    Assessment/Plan: 1) Acute blood loss anemia - hemodynamically stable and asymptomatic - PO ferrous sulfate  2) Blood Type --/--/B POS (10/29 6226) / Rubella 1.65 (03/16 0957) / Varicella Immune  3) TDAP status: received antepartum on 03/16/2019  4) Breastfeeding  5) Disposition: continue post-operative care.   LOS: 2 days   [place] 05/28/2019, 9:36 AM

## 2019-05-30 ENCOUNTER — Telehealth: Payer: Self-pay

## 2019-05-30 NOTE — Telephone Encounter (Signed)
Pt called after hour nurse 05/29/19 11;07am c/o had a c/s on Thurs; has a pump in and there is blood coming out where the pump is.  States no blood where the incision is.  States there is a puddle in there where the tubes are where it tapes.   P/c to pt to f/u - pt states CRS called her and adv to take the pump out which she did and everything is fine now.

## 2019-06-06 ENCOUNTER — Encounter: Payer: Self-pay | Admitting: Obstetrics and Gynecology

## 2019-06-06 ENCOUNTER — Other Ambulatory Visit: Payer: Self-pay

## 2019-06-06 ENCOUNTER — Ambulatory Visit (INDEPENDENT_AMBULATORY_CARE_PROVIDER_SITE_OTHER): Payer: 59 | Admitting: Obstetrics and Gynecology

## 2019-06-06 VITALS — BP 122/78 | Ht 59.0 in | Wt 130.0 lb

## 2019-06-06 DIAGNOSIS — Z09 Encounter for follow-up examination after completed treatment for conditions other than malignant neoplasm: Secondary | ICD-10-CM

## 2019-06-06 NOTE — Progress Notes (Signed)
   Postoperative Follow-up Patient presents post op from cesarean section  11 days ago.  Subjective: She denies fever, chills, nausea and vomiting. Eating a regular diet without difficulty. The patient is not having any pain.  Activity: increasing slowly. She notes some small issues with her incision.   On her right side she has more pain than the left. No oozing from the incision. No significant erythema.   Objective: BP 122/78   Ht 4\' 11"  (1.499 m)   Wt 130 lb (59 kg)   BMI 26.26 kg/m   Constitutional: Well nourished, well developed female in no acute distress.  HEENT: normal Skin: Warm and dry.  Abdomen: s, nt, nd, uterine fundus at U-3 clean, dry, intact and without erythema, induration, warmth, and tenderness There is a very small area on the right aspect that is about 1 cm below the incision line that is mildly erythematous without induration and warmth. It is slightly tender. Extremity: no edema   Assessment: 33 y.o. s/p cesarean section progressing well  Plan: Patient has done well after surgery with no apparent complications.  I have discussed the post-operative course to date, and the expected progress moving forward.  The patient understands what complications to be concerned about.    Activity plan: increase slowly  Follow up 1 week to reassess incision and have a mood check follow up.   Return in about 1 week (around 06/13/2019) for Post op incision check (may double or overbook SDJ).  Prentice Docker, MD 06/06/2019 11:58 AM

## 2019-06-10 DIAGNOSIS — Z20828 Contact with and (suspected) exposure to other viral communicable diseases: Secondary | ICD-10-CM | POA: Diagnosis not present

## 2019-06-14 ENCOUNTER — Ambulatory Visit (INDEPENDENT_AMBULATORY_CARE_PROVIDER_SITE_OTHER): Payer: 59 | Admitting: Obstetrics and Gynecology

## 2019-06-14 ENCOUNTER — Encounter: Payer: Self-pay | Admitting: Obstetrics and Gynecology

## 2019-06-14 ENCOUNTER — Telehealth: Payer: Self-pay | Admitting: Obstetrics and Gynecology

## 2019-06-14 ENCOUNTER — Other Ambulatory Visit: Payer: Self-pay

## 2019-06-14 VITALS — BP 120/70 | Ht 59.0 in | Wt 125.0 lb

## 2019-06-14 DIAGNOSIS — Z09 Encounter for follow-up examination after completed treatment for conditions other than malignant neoplasm: Secondary | ICD-10-CM

## 2019-06-14 DIAGNOSIS — Z98891 History of uterine scar from previous surgery: Secondary | ICD-10-CM

## 2019-06-14 NOTE — Progress Notes (Signed)
   Postoperative Follow-up Patient presents post op from cesarean section  2 weeks ago.  Subjective: She denies fever, chills, nausea and vomiting. Eating a regular diet without difficulty. The patient is not having any pain.  Activity: increasingly slowly, but no obvious limitations as she is taking it easy. She denies issues with her incision.    She states that she is doing much better from a mood standpoint, though her symptoms can come and go.  Objective: BP 120/70   Ht 4\' 11"  (1.499 m)   Wt 125 lb (56.7 kg)   Breastfeeding Yes   BMI 25.25 kg/m   Constitutional: Well nourished, well developed female in no acute distress.  HEENT: normal Skin: Warm and dry.  Abdomen: s/nt/nd, uterine fundus firm at U-4 clean, dry, intact and without erythema, induration, warmth, and tenderness Extremity: no edema   Assessment: 33 y.o. s/p cesarean section progressing well  Plan: Patient has done well after surgery with no apparent complications.  I have discussed the post-operative course to date, and the expected progress moving forward.  The patient understands what complications to be concerned about.    Activity plan: start slowly exercising after her 6 week appt.   Return in about 3 weeks (around 07/05/2019) for Six Week Postpartum/Nexplanon insertion.  Prentice Docker, MD 06/14/2019 11:40 AM

## 2019-06-14 NOTE — Telephone Encounter (Signed)
nexplanon with sdj on 12/11 at 950

## 2019-06-15 ENCOUNTER — Ambulatory Visit: Payer: 59 | Admitting: Obstetrics and Gynecology

## 2019-06-15 NOTE — Telephone Encounter (Signed)
Noted. Will order to arrive by apt date/time. 

## 2019-06-30 NOTE — Telephone Encounter (Signed)
Nexplanon reserved for this patient. 

## 2019-07-08 ENCOUNTER — Ambulatory Visit (INDEPENDENT_AMBULATORY_CARE_PROVIDER_SITE_OTHER): Payer: 59 | Admitting: Obstetrics and Gynecology

## 2019-07-08 ENCOUNTER — Encounter: Payer: Self-pay | Admitting: Obstetrics and Gynecology

## 2019-07-08 ENCOUNTER — Other Ambulatory Visit: Payer: Self-pay

## 2019-07-08 DIAGNOSIS — Z30017 Encounter for initial prescription of implantable subdermal contraceptive: Secondary | ICD-10-CM | POA: Diagnosis not present

## 2019-07-08 MED ORDER — ETONOGESTREL 68 MG ~~LOC~~ IMPL: 1.0000 | DRUG_IMPLANT | Freq: Once | SUBCUTANEOUS | 0 refills | Status: AC

## 2019-07-08 NOTE — Progress Notes (Signed)
Postpartum Visit   Chief Complaint  Patient presents with  . Postpartum Care   History of Present Illness: Patient is a 33 y.o. G3P2012 presents for postpartum visit.  Date of delivery: 05/26/2019 Type of delivery: C-Section Episiotomy No.  Pregnancy or labor problems:  Gestational diabetes  Any problems since the delivery:  no  Newborn Details:  SINGLETON :  1. Baby's name: [name]. Birth weight: 8.11 Maternal Details:  Breast Feeding:  yes Post partum depression/anxiety noted:  A little depression and anxiety.  Edinburgh Post-Partum Depression Score:  12. Doing well overall. Discussed anxiety/depression postpartum. She believes she is dealing well with the issue and would like to try no treatment at this time. Date of last PAP: 10/11/2018/NORMAL  Past Medical History:  Diagnosis Date  . GDM (gestational diabetes mellitus)   . GERD (gastroesophageal reflux disease)     Past Surgical History:  Procedure Laterality Date  . CESAREAN SECTION  2013   Gestation Diabetes/INduction  . CESAREAN SECTION N/A 05/26/2019   Procedure: CESAREAN SECTION;  Surgeon: Jackson, Stephen D, MD;  Location: [facility] ORS;  Service: Obstetrics;  Laterality: N/A;    Prior to Admission medications   Medication Sig Start Date End Date Taking? Authorizing Provider  acetaminophen (TYLENOL) 500 MG tablet Take 2 tablets (1,000 mg total) by mouth every 6 (six) hours as needed for moderate pain. Patient not taking: Reported on 06/14/2019 05/28/19   Schuman, Christanna R, MD  docusate sodium (COLACE) 100 MG capsule Take 1 capsule (100 mg total) by mouth 2 (two) times daily. Patient not taking: Reported on 06/14/2019 05/28/19 05/27/20  Schuman, Christanna R, MD  glucose blood (ACCU-CHEK GUIDE) test strip Use as instructed Patient not taking: Reported on 06/14/2019 12/31/18   Harris, Robert P, MD  ibuprofen (ADVIL) 600 MG tablet Take 1 tablet (600 mg total) by mouth every 6 (six) hours as needed. Patient not  taking: Reported on 06/14/2019 05/28/19   Schuman, Christanna R, MD  Prenatal Vit-Fe Fumarate-FA (PRENATAL MULTIVITAMIN) TABS tablet Take 1 tablet by mouth daily.     [provider]  sucralfate (CARAFATE) 1 g tablet Take 1 tablet (1 g total) by mouth 4 (four) times daily -  with meals and at bedtime. Patient not taking: Reported on 06/14/2019 04/28/19   Schuman, Christanna R, MD    No Known Allergies   Social History   Socioeconomic History  . Marital status: Married    Spouse name: Not on file  . Number of children: Not on file  . Years of education: Not on file  . Highest education level: Not on file  Occupational History  . Not on file  Tobacco Use  . Smoking status: Never Smoker  . Smokeless tobacco: Never Used  Substance and Sexual Activity  . Alcohol use: Not Currently    Comment: Occ  . Drug use: Never  . Sexual activity: Yes    Birth control/protection: Implant  Other Topics Concern  . Not on file  Social History Narrative   Police Officer with    Social Determinants of Health   Financial Resource Strain:   . Difficulty of Paying Living Expenses: Not on file  Food Insecurity:   . Worried About Running Out of Food in the Last Year: Not on file  . Ran Out of Food in the Last Year: Not on file  Transportation Needs:   . Lack of Transportation (Medical): Not on file  . Lack of Transportation (Non-Medical): Not on file  Physical Activity:   .   Days of Exercise per Week: Not on file  . Minutes of Exercise per Session: Not on file  Stress:   . Feeling of Stress : Not on file  Social Connections:   . Frequency of Communication with Friends and Family: Not on file  . Frequency of Social Gatherings with Friends and Family: Not on file  . Attends Religious Services: Not on file  . Active Member of Clubs or Organizations: Not on file  . Attends Club or Organization Meetings: Not on file  . Marital Status: Not on file  Intimate Partner Violence:   .  Fear of Current or Ex-Partner: Not on file  . Emotionally Abused: Not on file  . Physically Abused: Not on file  . Sexually Abused: Not on file    Family History  Problem Relation Age of Onset  . Multiple myeloma Mother   . Diabetes Mother     Review of Systems  Constitutional: Negative.   HENT: Negative.   Eyes: Negative.   Respiratory: Negative.   Cardiovascular: Negative.   Gastrointestinal: Negative.   Genitourinary: Negative.   Musculoskeletal: Negative.   Skin: Negative.   Neurological: Negative.   Psychiatric/Behavioral: Positive for depression. Negative for hallucinations, memory loss, substance abuse and suicidal ideas. The patient is not nervous/anxious and does not have insomnia.      Physical Exam BP 122/74   Ht 4' 11" (1.499 m)   Wt 121 lb (54.9 kg)   BMI 24.44 kg/m   Physical Exam Constitutional:      General: She is not in acute distress.    Appearance: Normal appearance. She is well-developed.  Genitourinary:     Pelvic exam was performed with patient in the lithotomy position.     Vulva, inguinal canal, urethra, bladder, vagina, uterus, right adnexa and left adnexa normal.     No posterior fourchette tenderness, injury or lesion present.     No cervical friability, lesion, bleeding or polyp.  HENT:     Head: Normocephalic and atraumatic.  Eyes:     General: No scleral icterus.    Conjunctiva/sclera: Conjunctivae normal.  Cardiovascular:     Rate and Rhythm: Normal rate and regular rhythm.     Heart sounds: No murmur. No friction rub. No gallop.   Pulmonary:     Effort: Pulmonary effort is normal. No respiratory distress.     Breath sounds: Normal breath sounds. No wheezing or rales.  Abdominal:     General: Bowel sounds are normal. There is no distension.     Palpations: Abdomen is soft. There is no mass.     Tenderness: There is no abdominal tenderness. There is no guarding or rebound.     Comments: without erythema, induration, warmth, and  tenderness. It is clean, dry, and intact.    Musculoskeletal:        General: Normal range of motion.     Cervical back: Normal range of motion and neck supple.  Neurological:     General: No focal deficit present.     Mental Status: She is alert and oriented to person, place, and time.     Cranial Nerves: No cranial nerve deficit.  Skin:    General: Skin is warm and dry.     Findings: No erythema.  Psychiatric:        Mood and Affect: Mood normal.        Behavior: Behavior normal.        Judgment: Judgment normal.     GYNECOLOGY   PROCEDURE NOTE  Patient is a 33 y.o. G3P2012 presenting for Nexplanon insertion as her desires means of contraception.  She provided informed consent, signed copy in the chart, time out was performed.   She understands that Nexplanon is a progesterone only therapy, and that patients often patients have irregular and unpredictable vaginal bleeding or amenorrhea. She understands that other side effects are possible related to systemic progesterone, including but not limited to, headaches, breast tenderness, nausea, and irritability. While effective at preventing pregnancy long acting reversible contraceptives do not prevent transmission of sexually transmitted diseases and use of barrier methods for this purpose was discussed. The placement procedure for Nexplanon was reviewed with the patient in detail including risks of nerve injury, infection, bleeding and injury to other muscles or tendons. She understands that the Nexplanon implant is good for 3 years and needs to be removed at the end of that time.  She understands that Nexplanon is an extremely effective option for contraception, with failure rate of <1%. This information is reviewed today and all questions were answered. Informed consent was obtained, both verbally and written.   The patient is healthy and has no contraindications to Nexplanon use. Urine pregnancy test was performed today and was  negative.  Procedure Appropriate time out taken.  Patient placed in dorsal supine with left arm above head, elbow flexed at 90 degrees, arm resting on examination table with hand behind her head.  The bicipital grove was palpated and site 8-10cm proximal to the medial epicondyle was indentified.  Per the manufacturer's recommendations, the insertion site was marked along a line 3-5 cm posterior (toward the triceps) to the bicipital groove and at 8-10 cm medial to the medial epicondyle. The insertion site was prepped with a two betadine swabs and then injected with 2.5 cc of 1% lidocaine without epinephrine.  Nexplanon removed form sterile blister packaging,  Device confirmed in needle, before inserting full length of needle, tenting up the skin as the needle was advance.  The drug eluting rod was then deployed by pulling back the slider per the manufactures recommendation.  The implant was palpable by the clinician as well as the patient.  The insertion site covered dressed with a 1/2" steri-strip before applying  a kerlex bandage pressure dressing..Minimal blood loss was noted during the procedure.  The patient tolerated the procedure well.   She was instructed to wear the bandage for 24 hours, call with any signs of infection.  She was given the Nexplanon card and instructed to have the rod removed in 3 years.  Female Chaperone present during breast and/or pelvic exam.  Assessment: 32 y.o. G3P2012 presenting for 6 week postpartum visit  Plan: Problem List Items Addressed This Visit    None    Visit Diagnoses    Postpartum care and examination    -  Primary   Relevant Medications   etonogestrel (NEXPLANON) 68 MG IMPL implant   Nexplanon insertion       Relevant Medications   etonogestrel (NEXPLANON) 68 MG IMPL implant       1) Contraception Education given regarding options for contraception, including Nexplanon insertion.  2)  Pap - ASCCP guidelines and rational discussed.  Patient  opts for routine screening interval  3) Patient underwent screening for postpartum depression with some concerns noted. Will continue to monitor on an as-needed basis.  4) Follow up 1 year for routine annual exam  Stephen Jackson, MD 07/08/2019 10:59 AM   

## 2019-07-27 ENCOUNTER — Telehealth: Payer: Self-pay

## 2019-07-27 ENCOUNTER — Other Ambulatory Visit: Payer: Self-pay | Admitting: Advanced Practice Midwife

## 2019-07-27 NOTE — Progress Notes (Unsigned)
Work note written for patient as requested.

## 2019-07-27 NOTE — Telephone Encounter (Signed)
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 [phone]  Pt was working [employer]; we gave note asking she be able to work from home which she did.  Needs note to states she was preg March -Oct; needs to complete her training as she is in non-compliance with it d/t pregnancy and covid.  Pt will p/u note.

## 2019-07-27 NOTE — Telephone Encounter (Signed)
Letter written and placed at front of office for her to pick up.

## 2019-07-27 NOTE — Telephone Encounter (Signed)
Pt aware.

## 2019-09-24 DIAGNOSIS — Z03818 Encounter for observation for suspected exposure to other biological agents ruled out: Secondary | ICD-10-CM | POA: Diagnosis not present

## 2019-10-10 NOTE — Telephone Encounter (Signed)
Nexplanon rcvd/charged 07/08/2019

## 2019-12-02 NOTE — Progress Notes (Signed)
Scheduled to complete physical 12/19/19 with Ron Smith,PA-C.   S/P 6 months post partem - still breast feeding.   AMD

## 2019-12-05 ENCOUNTER — Ambulatory Visit: Payer: 59

## 2019-12-05 ENCOUNTER — Other Ambulatory Visit: Payer: Self-pay

## 2019-12-05 DIAGNOSIS — Z0000 Encounter for general adult medical examination without abnormal findings: Secondary | ICD-10-CM

## 2019-12-05 LAB — POCT URINALYSIS DIPSTICK
Bilirubin, UA: POSITIVE
Blood, UA: NEGATIVE
Glucose, UA: NEGATIVE
Ketones, UA: NEGATIVE
Leukocytes, UA: NEGATIVE
Nitrite, UA: NEGATIVE
Protein, UA: POSITIVE — AB
Spec Grav, UA: >=1.03 — AB (ref 1.010–1.025)
Urobilinogen, UA: 0.2 E.U./dL
pH, UA: 5.5 (ref 5.0–8.0)

## 2019-12-06 LAB — CMP12+LP+TP+TSH+6AC+CBC/D/PLT
ALT: 19 IU/L (ref 0–32)
AST: 16 IU/L (ref 0–40)
Albumin/Globulin Ratio: 1.5 (ref 1.2–2.2)
Albumin: 4.7 g/dL (ref 3.8–4.8)
Alkaline Phosphatase: 87 IU/L (ref 39–117)
BUN/Creatinine Ratio: 25 — ABNORMAL HIGH (ref 9–23)
BUN: 17 mg/dL (ref 6–20)
Basophils Absolute: 0 10*3/uL (ref 0.0–0.2)
Basos: 1 %
Bilirubin Total: 0.5 mg/dL (ref 0.0–1.2)
Calcium: 9.4 mg/dL (ref 8.7–10.2)
Chloride: 103 mmol/L (ref 96–106)
Chol/HDL Ratio: 3 ratio (ref 0.0–4.4)
Cholesterol, Total: 174 mg/dL (ref 100–199)
Creatinine, Ser: 0.67 mg/dL (ref 0.57–1.00)
EOS (ABSOLUTE): 0.1 10*3/uL (ref 0.0–0.4)
Eos: 1 %
Estimated CHD Risk: <0.5 times avg. (ref 0.0–1.0)
Free Thyroxine Index: 1.6 (ref 1.2–4.9)
GFR calc Af Amer: 135 mL/min/{1.73_m2} (ref >59)
GFR calc non Af Amer: 118 mL/min/{1.73_m2} (ref >59)
GGT: 21 IU/L (ref 0–60)
Globulin, Total: 3.2 g/dL (ref 1.5–4.5)
Glucose: 133 mg/dL — ABNORMAL HIGH (ref 65–99)
HDL: 58 mg/dL (ref >39)
Hematocrit: 36.2 % (ref 34.0–46.6)
Hemoglobin: 12.3 g/dL (ref 11.1–15.9)
Immature Grans (Abs): 0 10*3/uL (ref 0.0–0.1)
Immature Granulocytes: 1 %
Iron: 226 ug/dL — ABNORMAL HIGH (ref 27–159)
LDH: 148 IU/L (ref 119–226)
LDL Chol Calc (NIH): 107 mg/dL — ABNORMAL HIGH (ref 0–99)
Lymphocytes Absolute: 1.5 10*3/uL (ref 0.7–3.1)
Lymphs: 31 %
MCH: 30.2 pg (ref 26.6–33.0)
MCHC: 34 g/dL (ref 31.5–35.7)
MCV: 89 fL (ref 79–97)
Monocytes Absolute: 0.3 10*3/uL (ref 0.1–0.9)
Monocytes: 6 %
Neutrophils Absolute: 3 10*3/uL (ref 1.4–7.0)
Neutrophils: 60 %
Phosphorus: 4.4 mg/dL — ABNORMAL HIGH (ref 3.0–4.3)
Platelets: 294 10*3/uL (ref 150–450)
Potassium: 3.8 mmol/L (ref 3.5–5.2)
RBC: 4.07 x10E6/uL (ref 3.77–5.28)
RDW: 12 % (ref 11.7–15.4)
Sodium: 139 mmol/L (ref 134–144)
T3 Uptake Ratio: 26 % (ref 24–39)
T4, Total: 6.3 ug/dL (ref 4.5–12.0)
TSH: 0.79 u[IU]/mL (ref 0.450–4.500)
Total Protein: 7.9 g/dL (ref 6.0–8.5)
Triglycerides: 44 mg/dL (ref 0–149)
Uric Acid: 4.3 mg/dL (ref 2.6–6.2)
VLDL Cholesterol Cal: 9 mg/dL (ref 5–40)
WBC: 5 10*3/uL (ref 3.4–10.8)

## 2019-12-19 ENCOUNTER — Ambulatory Visit: Payer: Self-pay | Admitting: Physician Assistant

## 2019-12-19 ENCOUNTER — Other Ambulatory Visit: Payer: Self-pay

## 2019-12-19 ENCOUNTER — Encounter: Payer: Self-pay | Admitting: Physician Assistant

## 2019-12-19 VITALS — BP 108/75 | HR 77 | Temp 98.4°F | Resp 12 | Wt 111.0 lb

## 2019-12-19 DIAGNOSIS — Z0000 Encounter for general adult medical examination without abnormal findings: Secondary | ICD-10-CM

## 2019-12-19 NOTE — Progress Notes (Signed)
   Subjective: Annual physical exam    Patient ID: Debra Wilson, female    DOB: [birth year], 34 y.o.   MRN: [id number]  HPI Patient presents as a physical exam.  Patient voices concern due to elevated glucose on labs.  Patient is a history of gestational diabetes.   Review of Systems     Gestational diabetes Objective:   Physical Exam HEENT was unremarkable.  Neck is supple for adenopathy or bruits.  Lungs are clear to auscultation.  Heart regular rate and rhythm.  Abdomen with negative HSM, normoactive bowel sounds, soft, nontender palpation.  No obvious deformity to the upper or lower extremities.  Patient full neck range of motion upper and lower extremities.  No obvious deformity to cervical lumbar spine.  Patient has full neck range of motion cervical lumbar spine.  Cranial nerves II through XII are grossly intact.       Assessment & Plan: Well exam  Patient labs shows glucose of 133, BUN 25, and iron 226. Hemoglobin A1c was 5.0.

## 2020-08-07 DIAGNOSIS — H5213 Myopia, bilateral: Secondary | ICD-10-CM | POA: Diagnosis not present

## 2021-01-15 DIAGNOSIS — Z124 Encounter for screening for malignant neoplasm of cervix: Secondary | ICD-10-CM | POA: Diagnosis not present

## 2021-01-15 DIAGNOSIS — Z01419 Encounter for gynecological examination (general) (routine) without abnormal findings: Secondary | ICD-10-CM | POA: Diagnosis not present

## 2021-02-14 ENCOUNTER — Ambulatory Visit: Payer: 59 | Admitting: Physician Assistant

## 2021-03-13 DIAGNOSIS — L308 Other specified dermatitis: Secondary | ICD-10-CM | POA: Diagnosis not present

## 2021-03-26 ENCOUNTER — Ambulatory Visit (INDEPENDENT_AMBULATORY_CARE_PROVIDER_SITE_OTHER): Payer: 59 | Admitting: Internal Medicine

## 2021-07-08 ENCOUNTER — Encounter: Payer: Self-pay | Admitting: Physician Assistant

## 2021-07-08 ENCOUNTER — Ambulatory Visit: Payer: Self-pay | Admitting: Physician Assistant

## 2021-07-08 DIAGNOSIS — R6889 Other general symptoms and signs: Secondary | ICD-10-CM

## 2021-07-08 LAB — POC COVID19 BINAXNOW: SARS Coronavirus 2 Ag: NEGATIVE

## 2021-07-08 LAB — POCT INFLUENZA A/B
Influenza A, POC: POSITIVE — AB
Influenza B, POC: NEGATIVE

## 2021-07-08 MED ORDER — PSEUDOEPH-BROMPHEN-DM 30-2-10 MG/5ML PO SYRP: 5.0000 mL | ORAL_SOLUTION | Freq: Four times a day (QID) | ORAL | 0 refills | Status: DC | PRN

## 2021-07-08 MED ORDER — OSELTAMIVIR PHOSPHATE 75 MG PO CAPS: 75.0000 mg | ORAL_CAPSULE | Freq: Two times a day (BID) | ORAL | 0 refills | Status: DC

## 2021-07-08 MED ORDER — IBUPROFEN 800 MG PO TABS: 800.0000 mg | ORAL_TABLET | Freq: Three times a day (TID) | ORAL | 0 refills | Status: DC | PRN

## 2021-07-08 NOTE — Progress Notes (Signed)
Symptoms: as of yesterday, cough, congestion, head ache, body ache, chills & not sure if having fever.Gretel Acre

## 2021-07-08 NOTE — Progress Notes (Signed)
   Subjective: Influenza    Patient ID: Debra Wilson, female    DOB: [birth year], 35 y.o.   MRN: [id number]  HPI Patient test positive for influenza today.  Patient is not taking the flu shot this season.  Denies recent travel or known contact with COVID-19.   Review of Systems Negative septa chief complaint    Objective:   Physical Exam This is a virtual visit       Assessment & Plan: Influenza   Patient given a prescription for Tamiflu, Bromfed-DM, and ibuprofen.  Advised to follow-up in 3 to 5 days if no improvement or worsening symptoms.

## 2021-09-17 ENCOUNTER — Other Ambulatory Visit: Payer: Self-pay

## 2021-09-17 ENCOUNTER — Ambulatory Visit: Payer: Self-pay | Admitting: Physician Assistant

## 2021-09-17 ENCOUNTER — Encounter: Payer: Self-pay | Admitting: Physician Assistant

## 2021-09-17 VITALS — Temp 98.0°F | Resp 12 | Ht 59.0 in | Wt 124.0 lb

## 2021-09-17 DIAGNOSIS — H6501 Acute serous otitis media, right ear: Secondary | ICD-10-CM

## 2021-09-17 MED ORDER — AMOXICILLIN 875 MG PO TABS: 875.0000 mg | ORAL_TABLET | Freq: Two times a day (BID) | ORAL | 0 refills | Status: AC

## 2021-09-17 MED ORDER — FEXOFENADINE-PSEUDOEPHED ER 60-120 MG PO TB12: 1.0000 | ORAL_TABLET | Freq: Two times a day (BID) | ORAL | 0 refills | Status: DC

## 2021-09-17 NOTE — Progress Notes (Signed)
° °  Subjective: Right ear pain and sore throat    Patient ID: Debra Wilson, female    DOB: 1986-06-14, 36 y.o.   MRN: [id number]  HPI  Patient complain of right ear pain and sore throat greater than 1.  Patient states that in 1 month.  Patient states over the past 2 months family members continue to get upper respiratory infections.  Patient states she is taking the COVID shots with boosters but i has not taken the flu shot.  Denies fever /chills associated complaint.  Patient tolerate food and fluids with mild discomfort.  Denies cough.  Review of Systems Gestational diabetes    Objective:   Physical Exam  No acute distress.  See nurses note and vital signs. HEENT is remarkable for pale retracted right TM.  Findings similar erythematous.  No auricle or cervical lymphadenopathy.  Lungs are clear to auscultation.  Heart regular rate and rhythm.      Assessment & Plan: Otitis media   Patient given a prescription for Augmentin and Allegra-D.  Advised to follow-up in 5 to 7 days if no improvement or worsening complaint.

## 2021-09-17 NOTE — Progress Notes (Signed)
Pt states she's been feeling ear pain since december 07/08/21 when was tested positive for the flu also with a sore throat/CL,RMA

## 2021-09-26 DIAGNOSIS — Z0000 Encounter for general adult medical examination without abnormal findings: Secondary | ICD-10-CM | POA: Diagnosis not present

## 2022-01-20 DIAGNOSIS — Z01419 Encounter for gynecological examination (general) (routine) without abnormal findings: Secondary | ICD-10-CM | POA: Diagnosis not present

## 2022-01-20 DIAGNOSIS — Z124 Encounter for screening for malignant neoplasm of cervix: Secondary | ICD-10-CM | POA: Diagnosis not present

## 2022-03-17 DIAGNOSIS — H16142 Punctate keratitis, left eye: Secondary | ICD-10-CM | POA: Diagnosis not present

## 2022-04-15 DIAGNOSIS — N9089 Other specified noninflammatory disorders of vulva and perineum: Secondary | ICD-10-CM | POA: Diagnosis not present

## 2022-06-02 ENCOUNTER — Ambulatory Visit
Admission: RE | Admit: 2022-06-02 | Discharge: 2022-06-02 | Disposition: A | Discharge status: Home / Self Care | Payer: 59 | Attending: Physician Assistant | Admitting: Physician Assistant

## 2022-06-02 ENCOUNTER — Ambulatory Visit
Admission: RE | Admit: 2022-06-02 | Discharge: 2022-06-02 | Disposition: A | Discharge status: Home / Self Care | Payer: 59 | Source: Ambulatory Visit | Attending: Physician Assistant | Admitting: Physician Assistant

## 2022-06-02 ENCOUNTER — Encounter: Payer: Self-pay | Admitting: Physician Assistant

## 2022-06-02 ENCOUNTER — Ambulatory Visit: Payer: Self-pay | Admitting: Physician Assistant

## 2022-06-02 DIAGNOSIS — M79644 Pain in right finger(s): Secondary | ICD-10-CM

## 2022-06-02 NOTE — Progress Notes (Signed)
   Subjective: Right thumb pain    Patient ID: Debra Wilson, female    DOB: [birth year], 36 y.o.   MRN: [id number]  HPI Patient complaining of right thumb pain secondary to her hyperextension injury which occurred 2 weeks ago.  Patient states she was removing her child who was stuck in a jumpy house.  Patient states she placed all the weight on the right hand and the thumb hyperextended she felt a "snapping" sensation.  Patient states she was unable to move the thumb for 3 to 4 hours.  Patient states since then pain has increased with hyperextension of the right thumb.  Patient denies loss sensation or loss of function.  Patient is right-hand dominant.   Review of Systems Negative except for chief complaint    Objective:   Physical Exam  See nurses notes for vital signs. No obvious deformity, edema, ecchymosis, or erythema to the right thumb.  Patient has full and equal range of motion of complaining of pain with full extension.      Assessment & Plan: Strain versus fracture right thumb  Further evaluation x-ray is warranted.

## 2022-06-02 NOTE — Progress Notes (Signed)
Pt states two weeks ago she was helping her child on the play ground while putting weight on her hands she hurd a crack and its been hurting since. Anything that pulls at or push at the joint its painful.

## 2022-06-05 ENCOUNTER — Telehealth: Payer: Self-pay

## 2022-06-05 DIAGNOSIS — M79644 Pain in right finger(s): Secondary | ICD-10-CM

## 2022-06-05 NOTE — Telephone Encounter (Signed)
Durward Parcel, PA reviewed imaging results with [facility].  Recommended Physical Therapy Referral. Dx:  Pain secondary to hyperextension incident of right thumb.  Referral faxed to [facility] for PT.  AMD

## 2022-06-26 DIAGNOSIS — H5213 Myopia, bilateral: Secondary | ICD-10-CM | POA: Diagnosis not present

## 2022-07-09 ENCOUNTER — Other Ambulatory Visit: Payer: Self-pay

## 2022-07-09 DIAGNOSIS — R6889 Other general symptoms and signs: Secondary | ICD-10-CM

## 2022-07-09 DIAGNOSIS — Z1152 Encounter for screening for COVID-19: Secondary | ICD-10-CM

## 2022-07-09 LAB — POCT INFLUENZA A/B
Influenza A, POC: POSITIVE — AB
Influenza B, POC: NEGATIVE

## 2022-07-09 LAB — POC COVID19 BINAXNOW: SARS Coronavirus 2 Ag: NEGATIVE

## 2022-07-09 MED ORDER — OSELTAMIVIR PHOSPHATE 75 MG PO CAPS: 75.0000 mg | ORAL_CAPSULE | Freq: Two times a day (BID) | ORAL | 0 refills | Status: DC

## 2022-07-09 MED ORDER — IBUPROFEN 800 MG PO TABS: 800.0000 mg | ORAL_TABLET | Freq: Three times a day (TID) | ORAL | 0 refills | Status: DC | PRN

## 2022-07-09 MED ORDER — PROMETHAZINE-DM 6.25-15 MG/5ML PO SYRP: 5.0000 mL | ORAL_SOLUTION | Freq: Four times a day (QID) | ORAL | 0 refills | Status: DC | PRN

## 2022-07-09 NOTE — Progress Notes (Signed)
Pt presents today for flu like symptoms; runny nose, congestion, sore throat, body aches & headache. Started yesterday.  Daughter tested Positive 07-07-22.

## 2022-07-09 NOTE — Progress Notes (Signed)
   Subjective: Influenza    Patient ID: Debra Wilson, female    DOB: [birth year], 36 y.o.   MRN: [id number]  HPI Patient complains of 2 days of cold symptoms consisting of bodyaches, fever/chills, cough, nasal congestion, and fatigue.  Patient stated daughter test positive for flu 3 days ago.  Denies recent travel or known contact with COVID-19.  Patient test positive for influenza prior to this encounter.   Review of Systems Negative septa chief complaint    Objective:   Physical Exam  This is a telephonic encounter.      Assessment & Plan: Influenza   Patient given a prescription for Tamiflu, Phenergan DM, and ibuprofen.  Patient will follow-up if no improvement or worsening complaint.

## 2022-07-10 ENCOUNTER — Other Ambulatory Visit: Payer: Self-pay

## 2022-07-10 ENCOUNTER — Other Ambulatory Visit: Payer: Self-pay | Admitting: Physician Assistant

## 2022-07-10 ENCOUNTER — Telehealth: Payer: Self-pay

## 2022-07-10 MED ORDER — OSELTAMIVIR PHOSPHATE 75 MG PO CAPS: 75.0000 mg | ORAL_CAPSULE | Freq: Two times a day (BID) | ORAL | 0 refills | Status: DC

## 2022-07-10 MED ORDER — IBUPROFEN 800 MG PO TABS: 800.0000 mg | ORAL_TABLET | Freq: Three times a day (TID) | ORAL | 0 refills | Status: DC | PRN

## 2022-07-10 MED ORDER — PROMETHAZINE-DM 6.25-15 MG/5ML PO SYRP: 5.0000 mL | ORAL_SOLUTION | Freq: Four times a day (QID) | ORAL | 0 refills | Status: DC | PRN

## 2022-07-16 NOTE — Telephone Encounter (Signed)
JB

## 2022-08-01 DIAGNOSIS — Z3202 Encounter for pregnancy test, result negative: Secondary | ICD-10-CM | POA: Diagnosis not present

## 2022-08-01 DIAGNOSIS — Z3046 Encounter for surveillance of implantable subdermal contraceptive: Secondary | ICD-10-CM | POA: Diagnosis not present

## 2022-10-06 NOTE — Progress Notes (Unsigned)
S/Sx started Sunday: Fever (100 F) Runny nose Body aches Sore throat Headache  Daughter tested positive for covid last week  AMD

## 2022-10-07 ENCOUNTER — Other Ambulatory Visit: Payer: Self-pay

## 2022-10-07 DIAGNOSIS — Z1152 Encounter for screening for COVID-19: Secondary | ICD-10-CM

## 2022-10-07 LAB — POC COVID19 BINAXNOW: SARS Coronavirus 2 Ag: NEGATIVE

## 2022-10-07 LAB — POCT INFLUENZA A/B
Influenza A, POC: NEGATIVE
Influenza B, POC: NEGATIVE

## 2022-10-07 NOTE — Progress Notes (Signed)
Tested for covid/flu at request, day 3 of body aches, congestion, elevated temp reported.  OTC meds discussed with s/s questions.  Results of testing given to pt and provider of negative results.

## 2022-11-06 DIAGNOSIS — F4322 Adjustment disorder with anxiety: Secondary | ICD-10-CM | POA: Diagnosis not present

## 2022-11-14 ENCOUNTER — Ambulatory Visit: Payer: Self-pay

## 2022-11-14 DIAGNOSIS — Z0000 Encounter for general adult medical examination without abnormal findings: Secondary | ICD-10-CM

## 2022-11-14 LAB — POCT URINALYSIS DIPSTICK
Bilirubin, UA: NEGATIVE
Blood, UA: POSITIVE
Glucose, UA: NEGATIVE
Ketones, UA: NEGATIVE
Leukocytes, UA: NEGATIVE
Nitrite, UA: NEGATIVE
Protein, UA: NEGATIVE
Spec Grav, UA: 1.02 (ref 1.010–1.025)
Urobilinogen, UA: 0.2 E.U./dL
pH, UA: 6 (ref 5.0–8.0)

## 2022-11-15 LAB — CMP12+LP+TP+TSH+6AC+CBC/D/PLT
ALT: 29 IU/L (ref 0–32)
AST: 22 IU/L (ref 0–40)
Albumin/Globulin Ratio: 1.6 (ref 1.2–2.2)
Albumin: 4.4 g/dL (ref 3.9–4.9)
Alkaline Phosphatase: 61 IU/L (ref 44–121)
BUN/Creatinine Ratio: 21 (ref 9–23)
BUN: 11 mg/dL (ref 6–20)
Basophils Absolute: 0 10*3/uL (ref 0.0–0.2)
Basos: 0 %
Bilirubin Total: 0.2 mg/dL (ref 0.0–1.2)
Calcium: 9.1 mg/dL (ref 8.7–10.2)
Chloride: 105 mmol/L (ref 96–106)
Chol/HDL Ratio: 3.1 ratio (ref 0.0–4.4)
Cholesterol, Total: 186 mg/dL (ref 100–199)
Creatinine, Ser: 0.53 mg/dL — ABNORMAL LOW (ref 0.57–1.00)
EOS (ABSOLUTE): 0.1 10*3/uL (ref 0.0–0.4)
Eos: 1 %
Estimated CHD Risk: <0.5 times avg. (ref 0.0–1.0)
Free Thyroxine Index: 1.7 (ref 1.2–4.9)
GGT: 37 IU/L (ref 0–60)
Globulin, Total: 2.8 g/dL (ref 1.5–4.5)
Glucose: 94 mg/dL (ref 70–99)
HDL: 60 mg/dL (ref >39)
Hematocrit: 34.1 % (ref 34.0–46.6)
Hemoglobin: 11.7 g/dL (ref 11.1–15.9)
Immature Grans (Abs): 0 10*3/uL (ref 0.0–0.1)
Immature Granulocytes: 0 %
Iron: 145 ug/dL (ref 27–159)
LDH: 140 IU/L (ref 119–226)
LDL Chol Calc (NIH): 114 mg/dL — ABNORMAL HIGH (ref 0–99)
Lymphocytes Absolute: 1.4 10*3/uL (ref 0.7–3.1)
Lymphs: 20 %
MCH: 30.7 pg (ref 26.6–33.0)
MCHC: 34.3 g/dL (ref 31.5–35.7)
MCV: 90 fL (ref 79–97)
Monocytes Absolute: 0.4 10*3/uL (ref 0.1–0.9)
Monocytes: 6 %
Neutrophils Absolute: 4.9 10*3/uL (ref 1.4–7.0)
Neutrophils: 73 %
Phosphorus: 3.4 mg/dL (ref 3.0–4.3)
Platelets: 241 10*3/uL (ref 150–450)
Potassium: 3.8 mmol/L (ref 3.5–5.2)
RBC: 3.81 x10E6/uL (ref 3.77–5.28)
RDW: 12.3 % (ref 11.7–15.4)
Sodium: 138 mmol/L (ref 134–144)
T3 Uptake Ratio: 22 % — ABNORMAL LOW (ref 24–39)
T4, Total: 7.9 ug/dL (ref 4.5–12.0)
TSH: 1.53 u[IU]/mL (ref 0.450–4.500)
Total Protein: 7.2 g/dL (ref 6.0–8.5)
Triglycerides: 64 mg/dL (ref 0–149)
Uric Acid: 3.8 mg/dL (ref 2.6–6.2)
VLDL Cholesterol Cal: 12 mg/dL (ref 5–40)
WBC: 6.9 10*3/uL (ref 3.4–10.8)
eGFR: 124 mL/min/{1.73_m2} (ref >59)

## 2022-11-17 ENCOUNTER — Encounter: Payer: Self-pay | Admitting: Physician Assistant

## 2022-11-18 ENCOUNTER — Ambulatory Visit: Payer: Self-pay | Admitting: Physician Assistant

## 2022-11-18 ENCOUNTER — Encounter: Payer: Self-pay | Admitting: Physician Assistant

## 2022-11-18 VITALS — BP 122/79 | HR 79 | Temp 97.9°F | Resp 14 | Ht 59.0 in | Wt 127.0 lb

## 2022-11-18 DIAGNOSIS — Z0000 Encounter for general adult medical examination without abnormal findings: Secondary | ICD-10-CM

## 2022-11-18 NOTE — Progress Notes (Signed)
Here for physical with provider with [facility].  Stated increased more exhaustion than usual x approximately one week with busy schedule with work and parenting.  Stated using PRN Zyrtec at times, but has used this over the years and never noted drowsiness.  Recommended using Zyrtec at hs if needed.  Denies any other c/o or changes.

## 2022-11-18 NOTE — Progress Notes (Signed)
[facility] ____________________________________________   None    (approximate)  I have reviewed the triage vital signs and the nursing notes.   HISTORY  Chief Complaint No chief complaint on file.  HPI Debra Wilson is a 37 y.o. female          Past Medical History:  Diagnosis Date   GDM (gestational diabetes mellitus)    GERD (gastroesophageal reflux disease)     Patient Active Problem List   Diagnosis Date Noted   Gestational diabetes mellitus (GDM) in third trimester 11/24/2018   History of gestational diabetes 10/18/2018   History of cesarean delivery 10/18/2018   Supervision of high risk pregnancy, antepartum 10/11/2018    Past Surgical History:  Procedure Laterality Date   CESAREAN SECTION  2013   Gestation Diabetes/INduction   CESAREAN SECTION N/A 05/26/2019   Procedure: CESAREAN SECTION;  Surgeon: Conard Novak, MD;  Location: [facility] ORS;  Service: Obstetrics;  Laterality: N/A;    Prior to Admission medications   Medication Sig Start Date End Date Taking? Authorizing Provider  acetaminophen (TYLENOL) 500 MG tablet Take 500 mg by mouth every 6 (six) hours as needed for fever, mild pain or moderate pain. Patient not taking: Reported on 11/18/2022    [provider]  etonogestrel (NEXPLANON) 68 MG IMPL implant 1 each (68 mg total) by Subdermal route once for 1 dose. 07/08/19 07/09/22  Conard Novak, MD    Allergies Patient has no known allergies.  Family History  Problem Relation Age of Onset   Multiple myeloma Mother    Diabetes Mother     Social History Social History   Tobacco Use   Smoking status: Never   Smokeless tobacco: Never  Vaping Use   Vaping Use: Never used  Substance Use Topics   Alcohol use: Not Currently    Comment: Occ   Drug use: Never    Review of Systems Constitutional: No fever/chills Eyes: No visual changes. ENT: No sore throat. Cardiovascular: Denies chest  pain. Respiratory: Denies shortness of breath. Gastrointestinal: No abdominal pain.  No nausea, no vomiting.  No diarrhea.  No constipation.  GERD Genitourinary: Negative for dysuria. Musculoskeletal: Negative for back pain. Skin: Negative for rash. Neurological: Negative for headaches, focal weakness or numbness.  ____________________________________________   PHYSICAL EXAM:  VITAL SIGNS: Constitutional: Ale BP 122/79  Pulse 79  Resp 14  Temp 97.9 F (36.6 C)  SpO2 99 %  Weight 127 lb (57.6 kg)  Height  (1.499 m)  rt and oriented. Well appearing and in no acute distress. Eyes: Conjunctivae are normal. PERRL. EOMI. Head: Atraumatic. Nose: No congestion/rhinnorhea. Mouth/Throat: Mucous membranes are moist.  Oropharynx non-erythematous. Neck: No stridor.  No cervical spine tenderness to palpation. Hematological/Lymphatic/Immunilogical: No cervical lymphadenopathy. Cardiovascular: Normal rate, regular rhythm. Grossly normal heart sounds.  Good peripheral circulation. Respiratory: Normal respiratory effort.  No retractions. Lungs CTAB. Gastrointestinal: Soft and nontender. No distention. No abdominal bruits. No CVA tenderness. Genitourinary: Deferred Musculoskeletal: No lower extremity tenderness nor edema.  No joint effusions. Neurologic:  Normal speech and language. No gross focal neurologic deficits are appreciated. No gait instability. Skin:  Skin is warm, dry and intact. No rash noted. Psychiatric: Mood and affect are normal. Speech and behavior are normal.  ____________________________________________   LABS  ___  Ref Range & Units 4 d ago (11/14/22) 2 yr ago (12/05/19) 3 yr ago (05/06/19) 3 yr ago (04/14/19) 3 yr ago (04/08/19) 3 yr ago (03/30/19) 4 yr  ago (11/12/18)  Color, UA Yellow Yellow       Clarity, UA Clear Clear       Glucose, UA Negative Negative Negative       Bilirubin, UA Negative Positive CM       Ketones, UA Negative Negative       Spec Grav,  UA 1.010 - 1.025 1.020 >=1.030 Abnormal  R R R R R  Blood, UA Positive Negative       Comment: 2+ (Supposed to start period any day)  pH, UA 5.0 - 8.0 6.0 5.5 R R R R R  Protein, UA Negative Negative Positive Abnormal  CM       Urobilinogen, UA 0.2 or 1.0 E.U./dL 0.2 0.2 R R R R R  Nitrite, UA Negative Negative       Leukocytes, UA Negative Negative Negative R R R R R  Appearance         Odor                       Component Ref Range & Units 4 d ago (11/14/22) 2 yr ago (12/05/19) 3 yr ago (05/27/19) 3 yr ago (05/26/19) 4 yr ago (10/11/18) 4 yr ago (09/30/18)  Glucose 70 - 99 mg/dL 94 098 High  R    119 High  R  Uric Acid 2.6 - 6.2 mg/dL 3.8 4.3 CM    2.8 R, CM  Comment:            Therapeutic target for gout patients: <6.0  BUN 6 - 20 mg/dL Creatinine, Ser 0.57 - 1.00 mg/dL 1.47 Low  8.29    5.62  eGFR >59 mL/min/1.73 124       BUN/Creatinine Ratio 9 - High     13  Sodium 134 - 144 mmol/L 138 139    135  Potassium 3.5 - 5.2 mmol/L 3.8 3.8    3.7  Chloride 96 - 106 mmol/L 105 103    103  Calcium 8.7 - 10.2 mg/dL 9.1 9.4    9.1  Phosphorus 3.0 - 4.3 mg/dL 3.4 4.4 High     3.1  Total Protein 6.0 - 8.5 g/dL 7.2 7.9    7.4  Albumin 3.9 - 4.9 g/dL 4.4 4.7 R    4.3 R  Globulin, Total 1.5 - 4.5 g/dL 2.8 3.2    3.1  Albumin/Globulin Ratio 1.2 - 2.2 1.6 1.5    1.4  Bilirubin Total 0.0 - 1.2 mg/dL 0.2 0.5    0.3  Alkaline Phosphatase 44 - 121 IU/L 61 87 R    42 R  LDH 119 - 226 IU/L 140 148    124  AST 0 - 40 IU/L ALT 0 - 32 IU/L GGT 0 - 60 IU/L 37 21    15  Iron 27 - 159 ug/dL 130 865 High     784  Cholesterol, Total 100 - 199 mg/dL 696 295    284  Triglycerides 0 - 149 mg/dL 64 44    68  HDL >13 mg/dL 60 58    52  VLDL Cholesterol Cal 5 - 40 mg/dL LDL Chol Calc (NIH) 0 - 99 mg/dL 244 High  010 High       Chol/HDL Ratio 0.0 - 4.4 ratio 3.1 3.0 CM  3.0 CM  Comment:                                    T. Chol/HDL Ratio                                             Men  Women                               1/2 Avg.Risk  3.4    3.3                                   Avg.Risk  5.0    4.4                                2X Avg.Risk  9.6    7.1                                3X Avg.Risk 23.4   11.0  Estimated CHD Risk 0.0 - 1.0 times avg.  < 0.5  < 0.5 CM     < 0.5 CM  Comment: The CHD Risk is based on the T. Chol/HDL ratio. Other factors affect CHD Risk such as hypertension, smoking, diabetes, severe obesity, and family history of premature CHD.  TSH 0.450 - 4.500 uIU/mL 1.530 0.790    0.816  T4, Total 4.5 - 12.0 ug/dL 7.9 6.3    9.6  T3 Uptake Ratio 24 - 39 % 22 Low  26    22 Low   Free Thyroxine Index 1.2 - 4.9 1.7 1.6    2.1  WBC 3.4 - 10.8 x10E3/uL 6.9 5.0 8.3 R 7.1 R 6.3 5.5  RBC 3.77 - 5.28 x10E6/uL 3.81 4.07 2.96 Low  R 3.54 Low  R 4.01 3.79  Hemoglobin 11.1 - 15.9 g/dL 16.1 09.6 9.5 Low  R 04.5 Low  R 12.1 11.7  Hematocrit 34.0 - 46.6 % 34.1 36.2 27.6 Low  R 32.6 Low  R 36.6 33.8 Low   MCV 79 - 97 fL 90 89 93.2 R 92.1 R 91 89  MCH 26.6 - 33.0 pg 30.7 30.2 32.1 R 32.2 R 30.2 30.9  MCHC 31.5 - 35.7 g/dL 40.9 81.1 91.4 R 78.2 R 33.1 34.6  RDW 11.7 - 15.4 % 12.3 12.0 12.6 R 12.4 R 12.1 12.1  Platelets 150 - 450 x10E3/uL 241 294 133 Low  R 155 R 271 277  Neutrophils Not Estab. % 73 60    62  Lymphs Not Estab. % 20 31    30   Monocytes Not Estab. % 6 6    8   Eos Not Estab. % 1 1    0  Basos Not Estab. % 0 1    0  Neutrophils Absolute 1.4 - 7.0 x10E3/uL 4.9 3.0    3.4  Lymphocytes Absolute 0.7 - 3.1 x10E3/uL 1.4 1.5    1.6  Monocytes Absolute 0.1 - 0.9 x10E3/uL 0.4 0.3    0.4  EOS (ABSOLUTE) 0.0 - 0.4 x10E3/uL 0.1 0.1  0.0  Basophils Absolute 0.0 - 0.2 x10E3/uL 0.0 0.0    0.0  Immature Granulocytes Not Estab. % 0 1    0  Immature Grans (Abs)             _________________________________________    ____________________________________________   INITIAL IMPRESSION / ASSESSMENT AND PLAN  As part of my medical decision making, I reviewed the following data within the electronic MEDICAL RECORD NUMBER       No acute findings on physical exam      ____________________________________________   FINAL CLINICAL IMPRESSION  Well exam   ED Discharge Orders     None        Note:  This document was prepared using Dragon voice recognition software and may include unintentional dictation errors.

## 2022-11-20 ENCOUNTER — Other Ambulatory Visit: Payer: Self-pay | Admitting: Physician Assistant

## 2022-11-20 LAB — VITAMIN D 25 HYDROXY (VIT D DEFICIENCY, FRACTURES): Vit D, 25-Hydroxy: 25.1 ng/mL — ABNORMAL LOW (ref 30.0–100.0)

## 2022-11-20 LAB — VITAMIN B12: Vitamin B-12: 389 pg/mL (ref 232–1245)

## 2022-11-20 LAB — SPECIMEN STATUS REPORT

## 2022-11-20 MED ORDER — VITAMIN D (ERGOCALCIFEROL) 1.25 MG (50000 UNIT) PO CAPS: 50000.0000 [IU] | ORAL_CAPSULE | ORAL | 3 refills | Status: DC

## 2022-11-27 ENCOUNTER — Encounter: Payer: Self-pay | Admitting: Physician Assistant

## 2022-11-27 ENCOUNTER — Ambulatory Visit: Payer: Self-pay | Admitting: Physician Assistant

## 2022-11-27 VITALS — BP 118/83 | HR 97 | Temp 98.2°F | Resp 14

## 2022-11-27 DIAGNOSIS — F4322 Adjustment disorder with anxiety: Secondary | ICD-10-CM | POA: Diagnosis not present

## 2022-11-27 DIAGNOSIS — S66911A Strain of unspecified muscle, fascia and tendon at wrist and hand level, right hand, initial encounter: Secondary | ICD-10-CM
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
## 2022-12-03 ENCOUNTER — Ambulatory Visit: Payer: Self-pay | Admitting: Emergency Medicine

## 2022-12-03 DIAGNOSIS — W57XXXA Bitten or stung by nonvenomous insect and other nonvenomous arthropods, initial encounter: Secondary | ICD-10-CM

## 2022-12-03 MED ORDER — DOXYCYCLINE HYCLATE 100 MG PO CAPS: 100.0000 mg | ORAL_CAPSULE | Freq: Two times a day (BID) | ORAL | 0 refills | Status: DC

## 2022-12-03 NOTE — Progress Notes (Signed)
Pt has tick bite on right side of hip she found this morning.

## 2022-12-03 NOTE — Progress Notes (Unsigned)
37 year old female presents to the clinic with history of a tick bite that occurred most likely yesterday.  Patient states that she has some discomfort last evening and then noticed a tick on her right hip this morning which she removed completely.  Patient continues to have some soreness in the muscle area where the tick was bitten however there is minimal erythema and localized only to where the tick had bitten her.  No fever, headaches, joint aches.  Right hip pinpoint area consistent with a tick bite with minimal erythema however muscle tissue is tender to palpation and approximately a 5 cm area surrounding this.  A: Tick bite with muscle tenderness  P:  Patient is agreeable to begin Doxycycline.  Denies possibility of being pregnant.  Patient is to return if any continued problems and begin to the doxycycline today.

## 2022-12-16 DIAGNOSIS — F4322 Adjustment disorder with anxiety: Secondary | ICD-10-CM | POA: Diagnosis not present

## 2022-12-29 DIAGNOSIS — F4322 Adjustment disorder with anxiety: Secondary | ICD-10-CM | POA: Diagnosis not present

## 2023-01-27 DIAGNOSIS — Z124 Encounter for screening for malignant neoplasm of cervix: Secondary | ICD-10-CM | POA: Diagnosis not present

## 2023-01-27 DIAGNOSIS — Z01419 Encounter for gynecological examination (general) (routine) without abnormal findings: Secondary | ICD-10-CM | POA: Diagnosis not present

## 2023-11-17 ENCOUNTER — Encounter: Payer: Self-pay | Admitting: Physician Assistant

## 2023-11-17 ENCOUNTER — Ambulatory Visit: Payer: Self-pay | Admitting: Physician Assistant

## 2023-11-17 VITALS — BP 129/85 | HR 96 | Temp 98.4°F | Resp 16 | Ht 59.0 in | Wt 127.0 lb

## 2023-11-17 DIAGNOSIS — R0981 Nasal congestion: Secondary | ICD-10-CM

## 2023-11-17 LAB — POC COVID19 BINAXNOW: SARS Coronavirus 2 Ag: NEGATIVE

## 2023-11-17 LAB — POCT INFLUENZA A/B
Influenza A, POC: NEGATIVE
Influenza B, POC: NEGATIVE

## 2023-11-17 MED ORDER — AMOXICILLIN 875 MG PO TABS: 875.0000 mg | ORAL_TABLET | Freq: Two times a day (BID) | ORAL | 0 refills | Status: AC

## 2023-11-17 MED ORDER — METHYLPREDNISOLONE 4 MG PO TBPK: ORAL_TABLET | ORAL | 0 refills | Status: DC

## 2023-11-17 MED ORDER — FEXOFENADINE-PSEUDOEPHED ER 60-120 MG PO TB12
1.0000 | ORAL_TABLET | Freq: Two times a day (BID) | ORAL | 0 refills | Status: DC
Start: 2023-11-17 — End: 2023-12-09

## 2023-11-17 NOTE — Progress Notes (Signed)
   Subjective: Sinus congestion and bilateral ear pain    Patient ID: Debra Wilson, female    DOB: 02/14/1986, 38 y.o.   MRN: [id number]  HPI Patient complains of a week of sinus congestion, frontal headache, and bilateral ear pain.  Denies hearing loss.  Denies vertigo.  Intermittent fever but afebrile at this visit.  No palliative measure for complaint.   Review of Systems Negative except for above complaint    Objective:   Physical Exam BP 129/85  Pulse Rate 96  Temp 98.4 F (36.9 C)  Temp Source Oral  Weight 127 lb (57.6 kg)  Height 4\' 11"  (1.499 m)  Resp 16  SpO2 97 %  HEENT is remarkable for bilateral maxillary/frontal guarding with palpation.  Edematous nasal turbinates.  Edematous and erythematous.  Postnasal drainage. Neck is supple for lymphadenopathy or bruits. Lungs clear to auscultation.   Heart is regular rate and rhythm.      Assessment & Plan: Subacute maxillary sinusitis with eustachian tube dysfunction  Patient given a prescription for Allegra-D, amoxicillin , and Dosepak.  Patient vies follow-up in 5 days if no improvement or worsening complaint.

## 2023-11-17 NOTE — Progress Notes (Signed)
 1x week Pt presents today with head congestion, bi-lateral ear pain feeling like head is in a bucket of water fever of 100.2 last night. Currently no fever. 98.6  As seen with ear camera pt ears are very red near ear drum.

## 2023-12-02 ENCOUNTER — Ambulatory Visit: Payer: Self-pay

## 2023-12-02 DIAGNOSIS — Z0000 Encounter for general adult medical examination without abnormal findings: Secondary | ICD-10-CM

## 2023-12-02 LAB — POCT URINALYSIS DIPSTICK
Bilirubin, UA: NEGATIVE
Glucose, UA: NEGATIVE
Ketones, UA: NEGATIVE
Leukocytes, UA: NEGATIVE
Nitrite, UA: NEGATIVE
Protein, UA: NEGATIVE
Spec Grav, UA: 1.02 (ref 1.010–1.025)
Urobilinogen, UA: 0.2 U/dL
pH, UA: 5.5 (ref 5.0–8.0)

## 2023-12-03 LAB — CMP12+LP+TP+TSH+6AC+CBC/D/PLT
ALT: 22 IU/L (ref 0–32)
AST: 29 IU/L (ref 0–40)
Albumin: 4.4 g/dL (ref 3.9–4.9)
Alkaline Phosphatase: 66 IU/L (ref 44–121)
BUN/Creatinine Ratio: 18 (ref 9–23)
BUN: 11 mg/dL (ref 6–20)
Basophils Absolute: 0 10*3/uL (ref 0.0–0.2)
Basos: 1 %
Bilirubin Total: <0.2 mg/dL (ref 0.0–1.2)
Calcium: 9.3 mg/dL (ref 8.7–10.2)
Chloride: 105 mmol/L (ref 96–106)
Chol/HDL Ratio: 4.4 ratio (ref 0.0–4.4)
Cholesterol, Total: 194 mg/dL (ref 100–199)
Creatinine, Ser: 0.6 mg/dL (ref 0.57–1.00)
EOS (ABSOLUTE): 0.1 10*3/uL (ref 0.0–0.4)
Eos: 3 %
Estimated CHD Risk: 1.1 times avg. — ABNORMAL HIGH (ref 0.0–1.0)
Free Thyroxine Index: 1.9 (ref 1.2–4.9)
GGT: 25 IU/L (ref 0–60)
Globulin, Total: 2.9 g/dL (ref 1.5–4.5)
Glucose: 95 mg/dL (ref 70–99)
HDL: 44 mg/dL (ref >39)
Hematocrit: 34.8 % (ref 34.0–46.6)
Hemoglobin: 11.6 g/dL (ref 11.1–15.9)
Immature Grans (Abs): 0 10*3/uL (ref 0.0–0.1)
Immature Granulocytes: 0 %
Iron: 96 ug/dL (ref 27–159)
LDH: 137 IU/L (ref 119–226)
LDL Chol Calc (NIH): 136 mg/dL — ABNORMAL HIGH (ref 0–99)
Lymphocytes Absolute: 1.5 10*3/uL (ref 0.7–3.1)
Lymphs: 35 %
MCH: 31.2 pg (ref 26.6–33.0)
MCHC: 33.3 g/dL (ref 31.5–35.7)
MCV: 94 fL (ref 79–97)
Monocytes Absolute: 0.4 10*3/uL (ref 0.1–0.9)
Monocytes: 10 %
Neutrophils Absolute: 2.2 10*3/uL (ref 1.4–7.0)
Neutrophils: 51 %
Phosphorus: 3.5 mg/dL (ref 3.0–4.3)
Platelets: 306 10*3/uL (ref 150–450)
Potassium: 4.5 mmol/L (ref 3.5–5.2)
RBC: 3.72 x10E6/uL — ABNORMAL LOW (ref 3.77–5.28)
RDW: 12.3 % (ref 11.7–15.4)
Sodium: 141 mmol/L (ref 134–144)
T3 Uptake Ratio: 27 % (ref 24–39)
T4, Total: 6.9 ug/dL (ref 4.5–12.0)
TSH: 0.707 u[IU]/mL (ref 0.450–4.500)
Total Protein: 7.3 g/dL (ref 6.0–8.5)
Triglycerides: 79 mg/dL (ref 0–149)
Uric Acid: 3.3 mg/dL (ref 2.6–6.2)
VLDL Cholesterol Cal: 14 mg/dL (ref 5–40)
WBC: 4.2 10*3/uL (ref 3.4–10.8)
eGFR: 120 mL/min/{1.73_m2} (ref >59)

## 2023-12-07 DIAGNOSIS — H5213 Myopia, bilateral: Secondary | ICD-10-CM | POA: Diagnosis not present

## 2023-12-09 ENCOUNTER — Encounter: Payer: Self-pay | Admitting: Physician Assistant

## 2023-12-09 ENCOUNTER — Ambulatory Visit: Payer: Self-pay | Admitting: Physician Assistant

## 2023-12-09 ENCOUNTER — Other Ambulatory Visit: Payer: Self-pay

## 2023-12-09 VITALS — BP 121/86 | HR 79 | Temp 97.1°F | Resp 14

## 2023-12-09 DIAGNOSIS — E559 Vitamin D deficiency, unspecified: Secondary | ICD-10-CM

## 2023-12-09 DIAGNOSIS — Z0000 Encounter for general adult medical examination without abnormal findings: Secondary | ICD-10-CM

## 2023-12-09 LAB — POCT GLYCOSYLATED HEMOGLOBIN (HGB A1C): HbA1c POC (<> result, manual entry): 5.2 % (ref 4.0–5.6)

## 2023-12-09 MED ORDER — VITAMIN D (ERGOCALCIFEROL) 1.25 MG (50000 UNIT) PO CAPS: 50000.0000 [IU] | ORAL_CAPSULE | ORAL | 3 refills | Status: AC

## 2023-12-09 NOTE — Progress Notes (Signed)
 [facility] ____________________________________________   None    (approximate)  I have reviewed the triage vital signs and the nursing notes.   HISTORY  Chief Complaint No chief complaint on file.   HPI Debra Wilson is a 38 y.o. female patient presents for annual physical exam.  Admits to noncompliance of vitamin D .  Requests hemoglobin A1c secondary to previous diagnosis of gestational diabetes.           Past Medical History:  Diagnosis Date   GDM (gestational diabetes mellitus)    GERD (gastroesophageal reflux disease)     Patient Active Problem List   Diagnosis Date Noted   Gestational diabetes mellitus (GDM) in third trimester 11/24/2018   History of gestational diabetes 10/18/2018   History of cesarean delivery 10/18/2018   Supervision of high risk pregnancy, antepartum 10/11/2018    Past Surgical History:  Procedure Laterality Date   CESAREAN SECTION  2013   Gestation Diabetes/INduction   CESAREAN SECTION N/A 05/26/2019   Procedure: CESAREAN SECTION;  Surgeon: Kris Pester, MD;  Location: [facility] ORS;  Service: Obstetrics;  Laterality: N/A;    Prior to Admission medications   Medication Sig Start Date End Date Taking? Authorizing Provider  etonogestrel  (NEXPLANON ) 68 MG IMPL implant 1 each (68 mg total) by Subdermal route once for 1 dose. Patient taking differently: 1 each by Subdermal route once. 07/08/19 07/09/22  Kris Pester, MD  Vitamin D , Ergocalciferol , (DRISDOL ) 1.25 MG (50000 UNIT) CAPS capsule Take 1 capsule (50,000 Units total) by mouth every 7 (seven) days. Patient not taking: Reported on 12/09/2023 11/20/22   Marcina Severe, PA-C    Allergies Patient has no known allergies.  Family History  Problem Relation Age of Onset   Multiple myeloma Mother    Diabetes Mother     Social History Social History   Tobacco Use   Smoking status: Never   Smokeless tobacco: Never  Vaping Use   Vaping  status: Never Used  Substance Use Topics   Alcohol use: Not Currently    Comment: Occ   Drug use: Never    Review of Systems Constitutional: No fever/chills Eyes: No visual changes. ENT: No sore throat. Cardiovascular: Denies chest pain. Respiratory: Denies shortness of breath. Gastrointestinal: No abdominal pain.  No nausea, no vomiting.  No diarrhea.  No constipation. Genitourinary: Negative for dysuria. Musculoskeletal: Negative for back pain. Skin: Negative for rash. Neurological: Negative for headaches, focal weakness or numbness.  ____________________________________________   PHYSICAL EXAM:  VITAL SIGNS: BP 121/86  Pulse Rate 79  Temp 97.1 F (36.2 C)Temp. 97.1 F (36.2 C). Data is abnormal. Taken on 12/09/23 9:22 AM  Resp 14  SpO2 97 %   Constitutional: Alert and oriented. Well appearing and in no acute distress. Eyes: Conjunctivae are normal. PERRL. EOMI. Head: Atraumatic. Nose: No congestion/rhinnorhea. Mouth/Throat: Mucous membranes are moist.  Oropharynx non-erythematous. Neck: No stridor. No cervical spine tenderness to palpation. Hematological/Lymphatic/Immunilogical: No cervical lymphadenopathy. Cardiovascular: Normal rate, regular rhythm. Grossly normal heart sounds.  Good peripheral circulation. Respiratory: Normal respiratory effort.  No retractions. Lungs CTAB. Gastrointestinal: Soft and nontender. No distention. No abdominal bruits. No CVA tenderness. Genitourinary: Deferred Musculoskeletal: No lower extremity tenderness nor edema.  No joint effusions. Neurologic:  Normal speech and language. No gross focal neurologic deficits are appreciated. No gait instability. Skin:  Skin is warm, dry and intact. No rash noted. Psychiatric: Mood and affect are normal. Speech and behavior are normal.  ____________________________________________   Jolynn Needy  _          Component Ref Range & Units (hover) 7 d ago (12/02/23) 1 yr ago (11/14/22) 4 yr ago (12/05/19) 4  yr ago (05/06/19) 4 yr ago (04/14/19) 4 yr ago (04/08/19) 4 yr ago (03/30/19)  Color, UA yellow Yellow Yellow      Clarity, UA clear Clear Clear      Glucose, UA Negative Negative Negative      Bilirubin, UA neg Negative Positive CM      Ketones, UA neg Negative Negative      Spec Grav, UA 1.020 1.020 >=1.030 Abnormal  R R R R  Blood, UA 2+ Positive CM Negative      Comment: menses  pH, UA 5.5 6.0 5.5 R R R R  Protein, UA Negative Negative Positive Abnormal  CM      Urobilinogen, UA 0.2 0.2 0.2 R R R R  Nitrite, UA neg Negative Negative      Leukocytes, UA Negative Negative Negative R R R R  Appearance         Odor                    View All Conversations on this Encounter               Component Ref Range & Units (hover) 7 d ago (12/02/23) 1 yr ago (11/14/22) 4 yr ago (12/05/19) 4 yr ago (05/27/19) 4 yr ago (05/26/19) 5 yr ago (10/11/18) 5 yr ago (09/30/18)  Glucose 95 94 133 High  R    127 High  R  Uric Acid 3.3 3.8 CM 4.3 CM    2.8 R, CM  Comment:            Therapeutic target for gout patients: <6.0  BUN 11 11 17    8   Creatinine, Ser 0.60 0.53 Low  0.67    0.62  eGFR 120 124       BUN/Creatinine Ratio 18 21 25  High     13  Sodium 141 138 139    135  Potassium 4.5 3.8 3.8    3.7  Chloride 105 105 103    103  Calcium 9.3 9.1 9.4    9.1  Phosphorus 3.5 3.4 4.4 High     3.1  Total Protein 7.3 7.2 7.9    7.4  Albumin 4.4 4.4 4.7 R    4.3 R  Globulin, Total 2.9 2.8 3.2    3.1  Bilirubin Total <0.2 0.2 0.5    0.3  Alkaline Phosphatase 66 61 87 R    42 R  LDH 137 140 148    124  AST 29 22 16    18   ALT 22 29 19    14   GGT 25 37 21    15  Iron 96 145 226 High     159  Cholesterol, Total 194 186 174    157  Triglycerides 79 64 44    68  HDL 44 60 58    52  VLDL Cholesterol Cal 14 12 9    14   LDL Chol Calc (NIH) 136 High  114 High  107 High       Chol/HDL Ratio 4.4 3.1 CM 3.0 CM    3.0 CM  Comment:  T. Chol/HDL Ratio                                              Men  Women                               1/2 Avg.Risk  3.4    3.3                                   Avg.Risk  5.0    4.4                                2X Avg.Risk  9.6    7.1                                3X Avg.Risk 23.4   11.0  Estimated CHD Risk 1.1 High   < 0.5 CM  < 0.5 CM     < 0.5 CM  Comment: The CHD Risk is based on the T. Chol/HDL ratio. Other factors affect CHD Risk such as hypertension, smoking, diabetes, severe obesity, and family history of premature CHD.  TSH 0.707 1.530 0.790    0.816  T4, Total 6.9 7.9 6.3    9.6  T3 Uptake Ratio 27 22 Low  26    22 Low   Free Thyroxine Index 1.9 1.7 1.6    2.1  WBC 4.2 6.9 5.0 8.3 R 7.1 R 6.3 5.5  RBC 3.72 Low  3.81 4.07 2.96 Low  R 3.54 Low  R 4.01 3.79  Hemoglobin 11.6 11.7 12.3 9.5 Low  R 11.4 Low  R 12.1 11.7  Hematocrit 34.8 34.1 36.2 27.6 Low  R 32.6 Low  R 36.6 33.8 Low   MCV 94 90 89 93.2 R 92.1 R 91 89  MCH 31.2 30.7 30.2 32.1 R 32.2 R 30.2 30.9  MCHC 33.3 34.3 34.0 34.4 R 35.0 R 33.1 34.6  RDW 12.3 12.3 12.0 12.6 R 12.4 R 12.1 12.1  Platelets 306 241 294 133 Low  R 155 R 271 277  Neutrophils 51 73 60    62  Lymphs 35 20 31    30   Monocytes 10 6 6    8   Eos 3 1 1     0  Basos 1 0 1    0  Neutrophils Absolute 2.2 4.9 3.0    3.4  Lymphocytes Absolute 1.5 1.4 1.5    1.6  Monocytes Absolute 0.4 0.4 0.3    0.4  EOS (ABSOLUTE) 0.1 0.1 0.1    0.0  Basophils Absolute 0.0 0.0 0.0    0.0  Immature Granulocytes 0 0 1    0  Immature Grans (Abs) 0.0 0.0 0.0    0.0             ___________________________________________  EKG  Normal sinus rhythm at 70 bpm ____________________________________________    ____________________________________________   INITIAL IMPRESSION / ASSESSMENT AND PLAN  As part of my medical decision making, I reviewed the following data within the electronic MEDICAL RECORD NUMBER Notes from prior ED visits and [place] Controlled Substance Database  No acute findings on  physical exam, EKG, or labs.  Patient vitamin D  levels will be repeated.  Patient hemoglobin A1c was 5.2.      ____________________________________________   FINAL CLINICAL IMPRESSION Well exam    ED Discharge Orders     None        Note:  This document was prepared using Dragon voice recognition software and may include unintentional dictation errors.

## 2023-12-09 NOTE — Progress Notes (Signed)
 Yearly physical with provider and stated not taking her Rx Vit D.  Attempt to add Vit D labs to those at [facility].  Discussed importance of Vit D.  No other complaints.

## 2023-12-10 LAB — SPECIMEN STATUS REPORT

## 2023-12-10 LAB — VITAMIN D 25 HYDROXY (VIT D DEFICIENCY, FRACTURES): Vit D, 25-Hydroxy: 32.8 ng/mL (ref 30.0–100.0)

## 2023-12-18 ENCOUNTER — Other Ambulatory Visit: Payer: Self-pay

## 2023-12-18 NOTE — Progress Notes (Signed)
 Random UDS and ETOH completed after consents per [facility] protocol.
